# Patient Record
Sex: Female | Born: 1955 | Race: Black or African American | Hispanic: No | Marital: Married | Smoking: Never smoker
Health system: Southern US, Community
[De-identification: ages and names within clinical notes are randomized; demographics above are authoritative.]

## PROBLEM LIST (undated history)

## (undated) DIAGNOSIS — R232 Flushing: Secondary | ICD-10-CM

## (undated) DIAGNOSIS — D069 Carcinoma in situ of cervix, unspecified: Secondary | ICD-10-CM

## (undated) DIAGNOSIS — K439 Ventral hernia without obstruction or gangrene: Secondary | ICD-10-CM

## (undated) HISTORY — DX: Ventral hernia without obstruction or gangrene: K43.9

## (undated) HISTORY — PX: OTHER SURGICAL HISTORY: SHX169

## (undated) HISTORY — DX: Flushing: R23.2

## (undated) HISTORY — PX: ABDOMINAL HYSTERECTOMY: SHX81

## (undated) HISTORY — DX: Carcinoma in situ of cervix, unspecified: D06.9

## (undated) HISTORY — PX: COLON SURGERY: SHX602

---

## 2001-04-23 ENCOUNTER — Ambulatory Visit (HOSPITAL_COMMUNITY): Admission: RE | Admit: 2001-04-23 | Discharge: 2001-04-23 | Payer: Self-pay | Admitting: Obstetrics and Gynecology

## 2001-04-23 ENCOUNTER — Encounter: Payer: Self-pay | Admitting: Obstetrics and Gynecology

## 2002-04-28 ENCOUNTER — Ambulatory Visit (HOSPITAL_COMMUNITY): Admission: RE | Admit: 2002-04-28 | Discharge: 2002-04-28 | Payer: Self-pay | Admitting: Obstetrics and Gynecology

## 2002-04-28 ENCOUNTER — Encounter: Payer: Self-pay | Admitting: Obstetrics and Gynecology

## 2003-11-21 ENCOUNTER — Ambulatory Visit (HOSPITAL_COMMUNITY): Admission: RE | Admit: 2003-11-21 | Discharge: 2003-11-21 | Payer: Self-pay | Admitting: Obstetrics and Gynecology

## 2004-12-05 ENCOUNTER — Ambulatory Visit: Payer: Self-pay | Admitting: Orthopedic Surgery

## 2005-11-14 ENCOUNTER — Ambulatory Visit (HOSPITAL_COMMUNITY): Admission: RE | Admit: 2005-11-14 | Discharge: 2005-11-14 | Payer: Self-pay | Admitting: Obstetrics and Gynecology

## 2006-12-01 ENCOUNTER — Encounter: Payer: Self-pay | Admitting: Obstetrics and Gynecology

## 2006-12-01 ENCOUNTER — Observation Stay (HOSPITAL_COMMUNITY): Admission: RE | Admit: 2006-12-01 | Discharge: 2006-12-02 | Payer: Self-pay | Admitting: Obstetrics and Gynecology

## 2006-12-17 ENCOUNTER — Ambulatory Visit (HOSPITAL_COMMUNITY): Admission: RE | Admit: 2006-12-17 | Discharge: 2006-12-17 | Payer: Self-pay | Admitting: Obstetrics and Gynecology

## 2008-01-12 ENCOUNTER — Ambulatory Visit (HOSPITAL_COMMUNITY): Admission: RE | Admit: 2008-01-12 | Discharge: 2008-01-12 | Payer: Self-pay | Admitting: Obstetrics and Gynecology

## 2009-02-21 ENCOUNTER — Ambulatory Visit (HOSPITAL_COMMUNITY): Admission: RE | Admit: 2009-02-21 | Discharge: 2009-02-21 | Payer: Self-pay | Admitting: Obstetrics and Gynecology

## 2009-09-05 ENCOUNTER — Encounter: Payer: Self-pay | Admitting: Gastroenterology

## 2009-10-20 ENCOUNTER — Ambulatory Visit: Payer: Self-pay | Admitting: Gastroenterology

## 2009-10-20 ENCOUNTER — Ambulatory Visit (HOSPITAL_COMMUNITY): Admission: RE | Admit: 2009-10-20 | Discharge: 2009-10-20 | Payer: Self-pay | Admitting: Gastroenterology

## 2009-11-20 ENCOUNTER — Other Ambulatory Visit: Admission: RE | Admit: 2009-11-20 | Discharge: 2009-11-20 | Payer: Self-pay | Admitting: Obstetrics and Gynecology

## 2010-03-09 ENCOUNTER — Ambulatory Visit (HOSPITAL_COMMUNITY): Admission: RE | Admit: 2010-03-09 | Discharge: 2010-03-09 | Payer: Self-pay | Admitting: Obstetrics and Gynecology

## 2010-05-21 ENCOUNTER — Encounter: Payer: Self-pay | Admitting: Obstetrics and Gynecology

## 2010-05-29 NOTE — Letter (Signed)
Summary: [facility] ORDER  TCS ORDER   Imported By: Ave Filter 09/05/2009 09:20:56  _____________________________________________________________________  External Attachment:    Type:   Image     Comment:   External Document  Appended Document: [facility]CS ORDER HALFLYTELY.

## 2010-09-11 NOTE — Op Note (Signed)
Alexis Levine, Alexis Levine                 ACCOUNT NO.:  [id number]   MEDICAL RECORD NO.:  [id number]          PATIENT TYPE:  OBV   LOCATION:  A323                          FACILITY:  [facility]   PHYSICIAN:  Tilda Burrow, M.D. DATE OF BIRTH:  10/02/1955   DATE OF PROCEDURE:  12/01/2006  DATE OF DISCHARGE:  12/02/2006                               OPERATIVE REPORT   PREOPERATIVE DIAGNOSIS:  Cystocele, rectocele.   POSTOPERATIVE DIAGNOSIS:  Cystocele, rectocele.   OPERATION PERFORMED:  Anterior and posterior vaginal repair.   SURGEON:  Tilda Burrow, M.D.   ASSISTANTSharlot Gowda.   ANESTHESIA:  General.   COMPLICATIONS:  None.   ESTIMATED BLOOD LOSS:  50 mL.   FINDINGS:  Right-sided apical laxity beneath the bladder.   INDICATIONS FOR PROCEDURE:  A 55 year old female admitted for anterior  and posterior repair.   DESCRIPTION OF PROCEDURE:  The patient was taken to the operating room  for anterior and posterior repair after bowel prep.  See HPI for  details.  The procedure involved inspecting the perineum with the legs  in high lithotomy position.  The introitus was norma except for laxity.  The bladder bulged in an asymmetric fashion with some laxity on the  right side greater than the left side.  Beneath the bladder the vaginal  apex was actually well supported.  An ellipse of vaginal epithelium was  infiltrated with Xylocaine with epinephrine and then trimmed beneath the  cystocele, removing approximately 2 cm wide x 6 cm long ellipse of skin  and underlying tissues.  The bladder was left intact.  The defect was  closed after undermining the vaginal epithelium on either side slightly  and using transverse horizontal mattress sutures of 2-0 chromic to pull  the tissues together.  Bladder support was improved beneath the bladder  bulge.  The bulge could not be completely as the right side of the  bladder bulged asymmetrically.   Posterior repair was then addressed.  An Allis clamp  was placed at 5  o'clock and 7 o'clock at the ends of the hymen remnants and the  rectovaginal septum entered between the Allis clamps opening the vaginal  epithelium and dissecting the vaginal mucosa away from the underlying  connective tissue.  There was thin perineal body that ended up being  able to be dissected nicely on either side.  The patient had a site  specific defect on the left side which showed increased thickened tissue  on the right side where the tissues had fallen downward and to the  right.  This thickened tissue was able to be elevated upward and  attached to good pararectal supportive tissues on the right side  resulting in an elevation of the rectovaginal septum and an increased  thickening of that tissue.  The patient tolerated the procedure well.  The site specific repair consisted of four interrupted sutures of 0  Vicryl to pull the tissues into position.  We then dissected vaginal  epithelium further up the sides of the introitus and placed two  additional layers of interrupted 0 Vicryl resulting  in a thickening of  the perineal body, reduction of the vaginal introitus size and an even  improved support of the rectal tissues anteriorly.  The vaginal mucosa  only required a small bit of trimming but then was pulled together with  interrupted 2-0 Chromic.  Sponge and needle counts were correct  throughout.  The patient tolerated the procedure well, went to recovery  room in good condition.   ADDENDUM:  Patient has been seen back in the office for her four week  check up on her first check up on December 10, 2006 with good surgical  results and dramatically improved symptomatology.      Tilda Burrow, M.D.  Electronically Signed     JVF/MEDQ  D:  12/10/2006  T:  12/11/2006  Job:  [id number]   cc:   Annia Friendly. Loleta Chance, MD  Fax: [phone]

## 2010-09-11 NOTE — H&P (Signed)
NAMEBRENISHA, TSUI                 ACCOUNT NO.:  [id number]   MEDICAL RECORD NO.:  [id number]          PATIENT TYPE:  INP   LOCATION:  A323                          FACILITY:  [facility]   PHYSICIAN:  Tilda Burrow, M.D. DATE OF BIRTH:  [birth year]   DATE OF ADMISSION:  12/01/2006  DATE OF DISCHARGE:  08/05/2008LH                              HISTORY & PHYSICAL   ADMISSION DIAGNOSIS:  Cystocele,  rectocele.   HISTORY OF PRESENT ILLNESS:  This 55 year old female, gravida 2, para 2,  status post vaginal hysterectomy in 1985 who is admitted at this time  for anterior and posterior repair.  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  She has a sense of heaviness, pressure, rectal complaints.  She has  tried using a pessary for pelvic support and is frustrated by this  strategy.  She is admitted for anterior and posterior repair.  She  denies significant loss of urine when coughing or sneezing.  Bowel  movements are not a huge problem yet, though the rectocele is clinically  visually very significant and perineal laxity is noted by the patient.  There was slight fullness which has been evaluated and is simply the  presence of bowel in the pelvis, primarily small bowel based no  ultrasound which shows ovaries well out of the area of the vaginal apex.  Plans are to proceed with anterior and posterior repair.   PAST MEDICAL HISTORY:  Benign.   PAST SURGICAL HISTORY:  Right hemicolectomy by Dr. Katrinka Blazing for bowel  perforation.   PHYSICAL EXAMINATION:  GENERAL:  This is a generally, healthy, alert,  active, African American female.  VITAL SIGNS:  Height 5 feet 6 inches.  Weight 173 pounds.  Blood  pressure 112/70.  HEENT:  Pupils are equal, round and reactive.  Extraocular movements  intact.  NECK:  Supple. Normal thyroid.  CHEST:  Clear to auscultation  CARDIAC:   Regular rate and rhythm.  ABDOMEN:  Nontender without masses.  EXTERNAL GENITALIA:  Normal, multiparous with laxative introitus and  posterior rectocele, anterior laxity as well.  PELVIC:  Vaginal apex appears adequately supported at present with  ultrasound showing no evidence of ovarian  presence near the cuff.  EXTREMITIES:  Normal without cyanosis, clubbing or edema.   PLAN:  Anterior posterior repair December 01, 2006.      Tilda Burrow, M.D.  Electronically Signed     JVF/MEDQ  D:  12/01/2006  T:  12/01/2006  Job:  [id number]   cc:   Annia Friendly. Loleta Chance, MD  Fax: [phone]

## 2011-01-22 ENCOUNTER — Other Ambulatory Visit: Payer: Self-pay | Admitting: Adult Health

## 2011-01-22 ENCOUNTER — Other Ambulatory Visit (HOSPITAL_COMMUNITY)
Admission: RE | Admit: 2011-01-22 | Discharge: 2011-01-22 | Disposition: A | Discharge status: Home / Self Care | Payer: 59 | Source: Ambulatory Visit | Attending: Obstetrics and Gynecology | Admitting: Obstetrics and Gynecology

## 2011-01-22 DIAGNOSIS — Z01419 Encounter for gynecological examination (general) (routine) without abnormal findings: Secondary | ICD-10-CM | POA: Insufficient documentation

## 2011-02-11 LAB — COMPREHENSIVE METABOLIC PANEL
Alkaline Phosphatase: 57
BUN: 8
Creatinine, Ser: 0.61
Glucose, Bld: 85
Potassium: 4
Total Bilirubin: 0.6
Total Protein: 6.7

## 2011-02-11 LAB — CBC
HCT: 35 — ABNORMAL LOW
HCT: 38.2
Hemoglobin: 11.9 — ABNORMAL LOW
Hemoglobin: 13
MCV: 89.3
Platelets: 298
RBC: 3.9
RDW: 12.8
RDW: 13.4
WBC: 13.7 — ABNORMAL HIGH

## 2011-02-11 LAB — DIFFERENTIAL
Basophils Absolute: 0
Basophils Absolute: 0
Basophils Relative: 1
Eosinophils Relative: 0
Lymphocytes Relative: 6 — ABNORMAL LOW
Lymphocytes Relative: 29
Lymphs Abs: 0.8
Monocytes Absolute: 0.7
Monocytes Relative: 5
Monocytes Relative: 6
Neutro Abs: 12.2 — ABNORMAL HIGH
Neutro Abs: 3.5
Neutrophils Relative %: 63

## 2011-04-10 ENCOUNTER — Other Ambulatory Visit: Payer: Self-pay | Admitting: Obstetrics and Gynecology

## 2011-04-10 DIAGNOSIS — Z139 Encounter for screening, unspecified: Secondary | ICD-10-CM

## 2011-04-11 ENCOUNTER — Ambulatory Visit (HOSPITAL_COMMUNITY)
Admission: RE | Admit: 2011-04-11 | Discharge: 2011-04-11 | Disposition: A | Discharge status: Home / Self Care | Payer: BC Managed Care – PPO | Source: Ambulatory Visit | Attending: Obstetrics and Gynecology | Admitting: Obstetrics and Gynecology

## 2011-04-11 DIAGNOSIS — Z1231 Encounter for screening mammogram for malignant neoplasm of breast: Secondary | ICD-10-CM | POA: Insufficient documentation

## 2011-04-11 DIAGNOSIS — Z139 Encounter for screening, unspecified: Secondary | ICD-10-CM

## 2012-04-24 ENCOUNTER — Other Ambulatory Visit: Payer: Self-pay | Admitting: Obstetrics and Gynecology

## 2012-04-24 DIAGNOSIS — Z139 Encounter for screening, unspecified: Secondary | ICD-10-CM

## 2012-04-28 ENCOUNTER — Ambulatory Visit (HOSPITAL_COMMUNITY)
Admission: RE | Admit: 2012-04-28 | Discharge: 2012-04-28 | Disposition: A | Discharge status: Home / Self Care | Payer: BC Managed Care – PPO | Source: Ambulatory Visit | Attending: Obstetrics and Gynecology | Admitting: Obstetrics and Gynecology

## 2012-04-28 DIAGNOSIS — Z1231 Encounter for screening mammogram for malignant neoplasm of breast: Secondary | ICD-10-CM | POA: Insufficient documentation

## 2012-04-28 DIAGNOSIS — Z139 Encounter for screening, unspecified: Secondary | ICD-10-CM

## 2013-04-19 ENCOUNTER — Ambulatory Visit: Payer: Self-pay | Admitting: Adult Health

## 2013-04-26 ENCOUNTER — Encounter: Payer: Self-pay | Admitting: Adult Health

## 2013-04-26 ENCOUNTER — Ambulatory Visit (INDEPENDENT_AMBULATORY_CARE_PROVIDER_SITE_OTHER): Payer: BC Managed Care – PPO | Admitting: Adult Health

## 2013-04-26 VITALS — BP 120/80 | Ht 64.5 in | Wt 180.0 lb

## 2013-04-26 DIAGNOSIS — N951 Menopausal and female climacteric states: Secondary | ICD-10-CM

## 2013-04-26 DIAGNOSIS — J069 Acute upper respiratory infection, unspecified: Secondary | ICD-10-CM

## 2013-04-26 DIAGNOSIS — R232 Flushing: Secondary | ICD-10-CM

## 2013-04-26 HISTORY — DX: Flushing: R23.2

## 2013-04-26 MED ORDER — AZITHROMYCIN 250 MG PO TABS: ORAL_TABLET | ORAL | Status: DC

## 2013-04-26 MED ORDER — ESTRADIOL 0.1 MG/24HR TD PTWK: 0.1000 mg | MEDICATED_PATCH | TRANSDERMAL | Status: DC

## 2013-04-26 NOTE — Progress Notes (Signed)
Subjective:     Patient ID: Alexis Levine, female   DOB: [birth year], 57 y.o.   MRN: [id number]  HPI Alexis Levine is a 57 year old black female,married in complaining of hot flashes, after stopping patch in November.Has had cough for 3 weeks and sinus congestion had low grade fever last week.  Review of Systems See HPI Reviewed past medical,surgical, social and family history. Reviewed medications and allergies.     Objective:   Physical Exam BP 120/80  Ht 5' 4.5" (1.638 m)  Wt 180 lb (81.647 kg)  BMI 30.43 kg/m2   Skin warm and dry. Lungs: clear to ausculation bilaterally. Cardiovascular: regular rate and rhythm. Assessment:     Hot flashes URI    Plan:     Rx estradiol patch 0.1 mg #4 1 patch weekly with 11 refills Rx Z pack Follow up as scheduled for physical

## 2013-04-26 NOTE — Patient Instructions (Signed)
Resume patch Return for physical

## 2013-05-04 ENCOUNTER — Ambulatory Visit (INDEPENDENT_AMBULATORY_CARE_PROVIDER_SITE_OTHER): Payer: BC Managed Care – PPO | Admitting: Adult Health

## 2013-05-04 ENCOUNTER — Encounter: Payer: Self-pay | Admitting: Adult Health

## 2013-05-04 ENCOUNTER — Other Ambulatory Visit (HOSPITAL_COMMUNITY)
Admission: RE | Admit: 2013-05-04 | Discharge: 2013-05-04 | Disposition: A | Discharge status: Home / Self Care | Payer: BC Managed Care – PPO | Source: Ambulatory Visit | Attending: Adult Health | Admitting: Adult Health

## 2013-05-04 VITALS — BP 110/60 | HR 74 | Ht 64.5 in | Wt 181.0 lb

## 2013-05-04 DIAGNOSIS — Z01419 Encounter for gynecological examination (general) (routine) without abnormal findings: Secondary | ICD-10-CM | POA: Insufficient documentation

## 2013-05-04 DIAGNOSIS — Z1212 Encounter for screening for malignant neoplasm of rectum: Secondary | ICD-10-CM

## 2013-05-04 DIAGNOSIS — Z79899 Other long term (current) drug therapy: Secondary | ICD-10-CM

## 2013-05-04 DIAGNOSIS — Z1151 Encounter for screening for human papillomavirus (HPV): Secondary | ICD-10-CM | POA: Insufficient documentation

## 2013-05-04 LAB — HEMOCCULT GUIAC POC 1CARD (OFFICE): FECAL OCCULT BLD: NEGATIVE

## 2013-05-04 NOTE — Progress Notes (Signed)
Patient ID: Alexis KelchDebra Levine, female   DOB: 01/29/1956, 58 y.o.   MRN: [id number] History of Present Illness: Alexis KidneyDebra is a 58 year old black female, married in for a pap and physical.Has some vaginal dryness, but feels much better back on patch.   Current Medications, Allergies, Past Medical History, Past Surgical History, Family History and Social History were reviewed in Alexis Levine Link electronic medical record.   Past Medical History  Diagnosis Date  . Hot flashes 04/26/2013   Past Surgical History  Procedure Laterality Date  . Abdominal hysterectomy    . Colon surgery    . Bladder tact    Current outpatient prescriptions:estradiol (CLIMARA - DOSED IN MG/24 HR) 0.1 mg/24hr patch, Place 1 patch (0.1 mg total) onto the skin once a week., Disp: 4 patch, Rfl: 12;  azithromycin (ZITHROMAX) 250 MG tablet, Take 2 now and 1 daily x 4 days, Disp: 6 tablet, Rfl: 0  Review of Systems: Patient denies any headaches, blurred vision, shortness of breath, chest pain, abdominal pain, problems with bowel movements, urination, or intercourse. No joint pain or mood swings, see HPI.    Physical Exam:BP 110/60  Pulse 74  Ht 5' 4.5" (1.638 m)  Wt 181 lb (82.101 kg)  BMI 30.60 kg/m2 General:  Well developed, well nourished, no acute distress Skin:  Warm and dry Neck:  Midline trachea, normal thyroid Lungs; Clear to auscultation bilaterally Breast:  No dominant palpable mass, retraction, or nipple discharge Cardiovascular: Regular rate and rhythm Abdomen:  Soft, non tender, no hepatosplenomegaly, has small inguinal hernia that does not bother her. Pelvic:  External genitalia is normal in appearance.  The vagina is normal in appearance. The cervix and uterus are absent. Pap perfomed on vaginal area.  No  adnexal masses or tenderness noted. Rectal: Good sphincter tone, no polyps, or hemorrhoids felt.  Hemoccult negative.Has rectocele Extremities:  No swelling or varicosities noted Psych:  No mood changes, alert  and cooperative, seems happy Discussed trying luvena for vaginal moisture and astro glide for sex.   Impression: Yearly gyn exam Estrogen therapy   Plan: Physical in 2 years Mammogram yearly Colonoscopy per [facility] at work Try [facility]

## 2013-05-04 NOTE — Patient Instructions (Signed)
Physical in 1 year Mammogram yearly  Colonoscopy as per GI [facility] at work Try [facility]

## 2013-06-07 ENCOUNTER — Other Ambulatory Visit: Payer: Self-pay | Admitting: Obstetrics and Gynecology

## 2013-06-07 DIAGNOSIS — Z139 Encounter for screening, unspecified: Secondary | ICD-10-CM

## 2013-06-11 ENCOUNTER — Ambulatory Visit (HOSPITAL_COMMUNITY)
Admission: RE | Admit: 2013-06-11 | Discharge: 2013-06-11 | Disposition: A | Discharge status: Home / Self Care | Payer: BC Managed Care – PPO | Source: Ambulatory Visit | Attending: Obstetrics and Gynecology | Admitting: Obstetrics and Gynecology

## 2013-06-11 DIAGNOSIS — Z1231 Encounter for screening mammogram for malignant neoplasm of breast: Secondary | ICD-10-CM | POA: Insufficient documentation

## 2013-06-11 DIAGNOSIS — Z139 Encounter for screening, unspecified: Secondary | ICD-10-CM

## 2014-02-28 ENCOUNTER — Encounter: Payer: Self-pay | Admitting: Adult Health

## 2014-07-12 ENCOUNTER — Other Ambulatory Visit: Payer: Self-pay | Admitting: Adult Health

## 2014-07-21 ENCOUNTER — Ambulatory Visit (INDEPENDENT_AMBULATORY_CARE_PROVIDER_SITE_OTHER): Payer: BLUE CROSS/BLUE SHIELD | Admitting: Adult Health

## 2014-07-21 ENCOUNTER — Encounter: Payer: Self-pay | Admitting: Adult Health

## 2014-07-21 VITALS — BP 130/80 | HR 80 | Ht 64.0 in | Wt 185.0 lb

## 2014-07-21 DIAGNOSIS — Z01419 Encounter for gynecological examination (general) (routine) without abnormal findings: Secondary | ICD-10-CM | POA: Diagnosis not present

## 2014-07-21 DIAGNOSIS — K439 Ventral hernia without obstruction or gangrene: Secondary | ICD-10-CM

## 2014-07-21 DIAGNOSIS — Z1212 Encounter for screening for malignant neoplasm of rectum: Secondary | ICD-10-CM | POA: Diagnosis not present

## 2014-07-21 DIAGNOSIS — Z79899 Other long term (current) drug therapy: Secondary | ICD-10-CM

## 2014-07-21 HISTORY — DX: Ventral hernia without obstruction or gangrene: K43.9

## 2014-07-21 LAB — HEMOCCULT GUIAC POC 1CARD (OFFICE): Fecal Occult Blood, POC: NEGATIVE

## 2014-07-21 NOTE — Patient Instructions (Signed)
Physical in 1 year Mammogram yearly Colonoscopy per GI [facility] at work 

## 2014-07-21 NOTE — Progress Notes (Signed)
Patient ID: Alexis KelchDebra Levine, female   DOB: 11/10/1955, 59 y.o.   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  Current Medications, Allergies, Past Medical History, Past Surgical History, Family History and Social History were reviewed in [facility] Link electronic medical record.     Review of Systems: Patient denies any headaches, hearing loss, fatigue, blurred vision, shortness of breath, chest pain, abdominal pain, problems with bowel movements, urination, or intercourse. No joint pain or mood swings.+hot flashes    Physical Exam:BP 130/80 mmHg  Pulse 80  Ht 5\' 4"  (1.626 m)  Wt 185 lb (83.915 kg)  BMI 31.74 kg/m2 General:  Well developed, well nourished, no acute distress Skin:  Warm and dry Neck:  Midline trachea, normal thyroid, good ROM, no lymphadenopathy Lungs; Clear to auscultation bilaterally Breast:  No dominant palpable mass, retraction, or nipple discharge Cardiovascular: Regular rate and rhythm Abdomen:  Soft, non tender, no hepatosplenomegaly, has small ventral hernia LLQ, has noticed for about 10 years or so, no pain Pelvic:  External genitalia is normal in appearance, no lesions.  The vagina is pale, good moisture and has loss of rugae. Urethra has no lesions or masses. The cervix and uterus are absent.  No adnexal masses or tenderness noted.Bladder is non tender, no masses felt. Rectal: Good sphincter tone, no polyps, or hemorrhoids felt.  Hemoccult negative.+ rectocele Extremities/musculoskeletal:  No swelling or varicosities noted, no clubbing or cyanosis Psych:  No mood changes, alert and cooperative,seems happy   Impression: Well woman  gyn exam no pap Ventral hernia ET   Plan: Physical in 1 year Mammogram yearly Labs at work Colonoscopy per GI Continue climara patch has refills If has pain at hernia go to ER

## 2015-08-10 ENCOUNTER — Other Ambulatory Visit: Payer: Self-pay | Admitting: Obstetrics and Gynecology

## 2015-08-10 DIAGNOSIS — Z1231 Encounter for screening mammogram for malignant neoplasm of breast: Secondary | ICD-10-CM

## 2015-08-21 ENCOUNTER — Ambulatory Visit (INDEPENDENT_AMBULATORY_CARE_PROVIDER_SITE_OTHER): Payer: BLUE CROSS/BLUE SHIELD | Admitting: Adult Health

## 2015-08-21 ENCOUNTER — Encounter: Payer: Self-pay | Admitting: Adult Health

## 2015-08-21 VITALS — BP 126/90 | HR 68 | Ht 64.0 in | Wt 179.0 lb

## 2015-08-21 DIAGNOSIS — Z79899 Other long term (current) drug therapy: Secondary | ICD-10-CM

## 2015-08-21 DIAGNOSIS — Z90710 Acquired absence of both cervix and uterus: Secondary | ICD-10-CM | POA: Diagnosis not present

## 2015-08-21 DIAGNOSIS — Z1211 Encounter for screening for malignant neoplasm of colon: Secondary | ICD-10-CM | POA: Diagnosis not present

## 2015-08-21 DIAGNOSIS — Z79818 Long term (current) use of other agents affecting estrogen receptors and estrogen levels: Secondary | ICD-10-CM

## 2015-08-21 DIAGNOSIS — Z01419 Encounter for gynecological examination (general) (routine) without abnormal findings: Secondary | ICD-10-CM | POA: Diagnosis not present

## 2015-08-21 LAB — HEMOCCULT GUIAC POC 1CARD (OFFICE): Fecal Occult Blood, POC: NEGATIVE

## 2015-08-21 MED ORDER — PROMETHAZINE HCL 25 MG PO TABS: 25.0000 mg | ORAL_TABLET | Freq: Four times a day (QID) | ORAL | Status: DC | PRN

## 2015-08-21 MED ORDER — ESTRADIOL 0.1 MG/24HR TD PTWK: MEDICATED_PATCH | TRANSDERMAL | Status: DC

## 2015-08-21 MED ORDER — CIPROFLOXACIN HCL 500 MG PO TABS: 500.0000 mg | ORAL_TABLET | Freq: Two times a day (BID) | ORAL | Status: DC

## 2015-08-21 NOTE — Progress Notes (Signed)
Patient ID: Vertis KelchDebra Harps, female   DOB: 07/09/1955, 60 y.o.   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 She is going on cruise in May.  Current Medications, Allergies, Past Medical History, Past Surgical History, Family History and Social History were reviewed in [facility] Link electronic medical record.     Review of Systems: Patient denies any headaches, hearing loss, fatigue, blurred vision, shortness of breath, chest pain, abdominal pain, problems with bowel movements, urination, or intercourse. No joint pain or mood swings.    Physical Exam:BP 126/90 mmHg  Pulse 68  Ht 5\' 4"  (1.626 m)  Wt 179 lb (81.194 kg)  BMI 30.71 kg/m2 General:  Well developed, well nourished, no acute distress Skin:  Warm and dry Neck:  Midline trachea, normal thyroid, good ROM, no lymphadenopathy Lungs; Clear to auscultation bilaterally Breast:  No dominant palpable mass, retraction, or nipple discharge Cardiovascular: Regular rate and rhythm Abdomen:  Soft, non tender, no hepatosplenomegaly Pelvic:  External genitalia is normal in appearance, no lesions.  The vagina is normal in appearance. Urethra has no lesions or masses. The cervix and uterus are absent. No adnexal masses or tenderness noted.Bladder is non tender, no masses felt. Rectal: Good sphincter tone, no polyps, or hemorrhoids felt.  Hemoccult negative. Extremities/musculoskeletal:  No swelling or varicosities noted, no clubbing or cyanosis,mild rectoecele Psych:  No mood changes, alert and cooperative,seems happy   Impression: Well woman gyn exam no pap Current use ET   Plan: Refilled climara patch x 1 year Rx phenergan 25 mg #30 take 1 every 6 hours prn N/V with 1 refill Rx cipro 500 mg #20 take 1 bid for cruise  Physical in 1 year Mammogram  yearly Colonoscopy per GI Labs at work

## 2015-08-21 NOTE — Patient Instructions (Signed)
Physical in 1 year Mammogram yearly Colonoscopy per GI [facility] at work 

## 2015-08-23 ENCOUNTER — Ambulatory Visit (HOSPITAL_COMMUNITY)
Admission: RE | Admit: 2015-08-23 | Discharge: 2015-08-23 | Disposition: A | Discharge status: Home / Self Care | Payer: BLUE CROSS/BLUE SHIELD | Source: Ambulatory Visit | Attending: Obstetrics and Gynecology | Admitting: Obstetrics and Gynecology

## 2015-08-23 DIAGNOSIS — Z1231 Encounter for screening mammogram for malignant neoplasm of breast: Secondary | ICD-10-CM | POA: Insufficient documentation

## 2016-09-25 ENCOUNTER — Other Ambulatory Visit: Payer: Self-pay | Admitting: Adult Health

## 2016-11-11 ENCOUNTER — Other Ambulatory Visit (HOSPITAL_COMMUNITY)
Admission: RE | Admit: 2016-11-11 | Discharge: 2016-11-11 | Disposition: A | Discharge status: Home / Self Care | Payer: Commercial Managed Care - PPO | Source: Ambulatory Visit | Attending: Adult Health | Admitting: Adult Health

## 2016-11-11 ENCOUNTER — Ambulatory Visit (INDEPENDENT_AMBULATORY_CARE_PROVIDER_SITE_OTHER): Payer: Commercial Managed Care - PPO | Admitting: Adult Health

## 2016-11-11 ENCOUNTER — Encounter: Payer: Self-pay | Admitting: Adult Health

## 2016-11-11 VITALS — BP 110/80 | HR 80 | Ht 65.0 in | Wt 183.0 lb

## 2016-11-11 DIAGNOSIS — Z79890 Hormone replacement therapy (postmenopausal): Secondary | ICD-10-CM | POA: Diagnosis not present

## 2016-11-11 DIAGNOSIS — Z01419 Encounter for gynecological examination (general) (routine) without abnormal findings: Secondary | ICD-10-CM | POA: Diagnosis not present

## 2016-11-11 DIAGNOSIS — Z1212 Encounter for screening for malignant neoplasm of rectum: Secondary | ICD-10-CM | POA: Diagnosis not present

## 2016-11-11 DIAGNOSIS — Z79818 Long term (current) use of other agents affecting estrogen receptors and estrogen levels: Secondary | ICD-10-CM

## 2016-11-11 DIAGNOSIS — Z79899 Other long term (current) drug therapy: Secondary | ICD-10-CM

## 2016-11-11 DIAGNOSIS — Z1211 Encounter for screening for malignant neoplasm of colon: Secondary | ICD-10-CM

## 2016-11-11 LAB — HEMOCCULT GUIAC POC 1CARD (OFFICE): FECAL OCCULT BLD: NEGATIVE

## 2016-11-11 MED ORDER — PROMETHAZINE HCL 25 MG PO TABS: 25.0000 mg | ORAL_TABLET | Freq: Four times a day (QID) | ORAL | 1 refills | Status: DC | PRN

## 2016-11-11 MED ORDER — ESTRADIOL 0.1 MG/24HR TD PTWK: MEDICATED_PATCH | TRANSDERMAL | 12 refills | Status: DC

## 2016-11-11 MED ORDER — CIPROFLOXACIN HCL 500 MG PO TABS: 500.0000 mg | ORAL_TABLET | Freq: Two times a day (BID) | ORAL | 1 refills | Status: DC

## 2016-11-11 NOTE — Addendum Note (Signed)
Addended by: Cyril MourningGRIFFIN, JENNIFER A on: 11/11/2016 11:00 AM   Modules accepted: Orders

## 2016-11-11 NOTE — Addendum Note (Signed)
Addended by: Federico FlakeNES, PEGGY A on: 11/11/2016 11:10 AM   Modules accepted: Orders

## 2016-11-11 NOTE — Progress Notes (Signed)
Patient ID: Alexis KelchDebra Levine, female   DOB: 03/12/1956, 61 y.o.   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 She is active with her grandchildren too, and goes on cruises often.    Current Medications, Allergies, Past Medical History, Past Surgical History, Family History and Social History were reviewed in [facility] Link electronic medical record.     Review of Systems:  Patient denies any headaches, hearing loss, fatigue, blurred vision, shortness of breath, chest pain, abdominal pain, problems with bowel movements, urination(occasional SUI if had caffeine), or intercourse. No joint pain or mood swings.    Physical Exam:BP 110/80 (BP Location: Left Arm, Patient Position: Sitting, Cuff Size: Small)   Pulse 80   Ht 5\' 5"  (1.651 m)   Wt 183 lb (83 kg)   BMI 30.45 kg/m  General:  Well developed, well nourished, no acute distress Skin:  Warm and dry Neck:  Midline trachea, normal thyroid, good ROM, no lymphadenopathy Lungs; Clear to auscultation bilaterally Breast:  No dominant palpable mass, retraction, or nipple discharge Cardiovascular: Regular rate and rhythm Abdomen:  Soft, non tender, no hepatosplenomegaly Pelvic:  External genitalia is normal in appearance, no lesions.  The vagina is normal in appearance. Urethra has no lesions or masses. The cervix and uterus are absent, pap with HPV performed on vagina.  No adnexal masses or tenderness noted.Bladder is non tender, no masses felt. Rectal: Good sphincter tone, no polyps, or hemorrhoids felt.  Hemoccult negative.+rectocele Extremities/musculoskeletal:  No swelling or varicosities noted, no clubbing or cyanosis Psych:  No mood changes, alert and cooperative,seems happy PHQ 2 score 0. She requests refills on phenergan and  cipro.  Impression: 1. Well woman exam with routine gynecological exam   2. Current use of estrogen therapy   3. Screening for colorectal cancer       Plan: Physical in 1 year Meds ordered this encounter  Medications  . promethazine (PHENERGAN) 25 MG tablet    Sig: Take 1 tablet (25 mg total) by mouth every 6 (six) hours as needed for nausea or vomiting.    Dispense:  30 tablet    Refill:  1    Order Specific Question:   Supervising Provider    Answer:   Alexis Levine [[id number]]  . ciprofloxacin (CIPRO) 500 MG tablet    Sig: Take 1 tablet (500 mg total) by mouth 2 (two) times daily.    Dispense:  14 tablet    Refill:  1    Order Specific Question:   Supervising Provider    Answer:   Alexis Levine [[id number]]  . estradiol (CLIMARA - DOSED IN MG/24 HR) 0.1 mg/24hr patch    Sig: APPLY ONE PATCH TOPICALLY ONCE A WEEK --  **REMOVE  AND  DISCARD  USED  PATCHES**    Dispense:  4 patch    Refill:  12    Order Specific Question:   Supervising Provider    Answer:   Alexis Levine [[id number]]  Mammogram yearly Labs at work Colonoscopy per GI

## 2016-11-12 LAB — CYTOLOGY - PAP
Adequacy: ABSENT
Diagnosis: NEGATIVE
HPV (WINDOPATH): NOT DETECTED

## 2017-11-14 ENCOUNTER — Other Ambulatory Visit: Payer: Self-pay | Admitting: Adult Health

## 2018-01-14 ENCOUNTER — Other Ambulatory Visit: Payer: Self-pay | Admitting: Obstetrics and Gynecology

## 2018-01-14 DIAGNOSIS — Z1231 Encounter for screening mammogram for malignant neoplasm of breast: Secondary | ICD-10-CM

## 2018-01-15 ENCOUNTER — Ambulatory Visit (HOSPITAL_COMMUNITY)
Admission: RE | Admit: 2018-01-15 | Discharge: 2018-01-15 | Disposition: A | Discharge status: Home / Self Care | Payer: Commercial Managed Care - PPO | Source: Ambulatory Visit | Attending: Obstetrics and Gynecology | Admitting: Obstetrics and Gynecology

## 2018-01-15 DIAGNOSIS — Z1231 Encounter for screening mammogram for malignant neoplasm of breast: Secondary | ICD-10-CM | POA: Insufficient documentation

## 2018-10-20 ENCOUNTER — Other Ambulatory Visit: Payer: Self-pay | Admitting: *Deleted

## 2018-10-21 MED ORDER — ESTRADIOL 0.1 MG/24HR TD PTWK: MEDICATED_PATCH | TRANSDERMAL | 12 refills | Status: DC

## 2018-11-26 ENCOUNTER — Telehealth: Payer: Self-pay | Admitting: Adult Health

## 2018-11-26 MED ORDER — ESTRADIOL 0.1 MG/24HR TD PTWK: MEDICATED_PATCH | TRANSDERMAL | 4 refills | Status: DC

## 2018-11-26 NOTE — Telephone Encounter (Signed)
Requests estrogen patch be refilled for 3 month supply, will do

## 2019-02-22 ENCOUNTER — Other Ambulatory Visit (HOSPITAL_COMMUNITY): Payer: Self-pay | Admitting: Obstetrics and Gynecology

## 2019-02-22 DIAGNOSIS — Z1231 Encounter for screening mammogram for malignant neoplasm of breast: Secondary | ICD-10-CM

## 2019-03-11 ENCOUNTER — Ambulatory Visit (HOSPITAL_COMMUNITY)
Admission: RE | Admit: 2019-03-11 | Discharge: 2019-03-11 | Disposition: A | Discharge status: Home / Self Care | Payer: Commercial Managed Care - PPO | Source: Ambulatory Visit | Attending: Obstetrics and Gynecology | Admitting: Obstetrics and Gynecology

## 2019-03-11 ENCOUNTER — Other Ambulatory Visit: Payer: Self-pay

## 2019-03-11 DIAGNOSIS — Z1231 Encounter for screening mammogram for malignant neoplasm of breast: Secondary | ICD-10-CM | POA: Insufficient documentation

## 2019-05-17 ENCOUNTER — Other Ambulatory Visit: Payer: Self-pay

## 2019-05-17 ENCOUNTER — Ambulatory Visit: Payer: Commercial Managed Care - PPO | Attending: Internal Medicine

## 2019-05-17 DIAGNOSIS — Z20822 Contact with and (suspected) exposure to covid-19: Secondary | ICD-10-CM

## 2019-05-18 LAB — NOVEL CORONAVIRUS, NAA: SARS-CoV-2, NAA: NOT DETECTED

## 2020-03-29 ENCOUNTER — Other Ambulatory Visit (HOSPITAL_COMMUNITY): Payer: Self-pay | Admitting: Adult Health

## 2020-03-29 DIAGNOSIS — Z1231 Encounter for screening mammogram for malignant neoplasm of breast: Secondary | ICD-10-CM

## 2020-04-21 ENCOUNTER — Inpatient Hospital Stay (HOSPITAL_COMMUNITY)
Admission: EM | Admit: 2020-04-21 | Discharge: 2020-04-24 | DRG: 378 | Disposition: A | Discharge status: Home / Self Care | Payer: Commercial Managed Care - PPO | Attending: Family Medicine | Admitting: Family Medicine

## 2020-04-21 ENCOUNTER — Other Ambulatory Visit: Payer: Self-pay

## 2020-04-21 ENCOUNTER — Encounter (HOSPITAL_COMMUNITY): Payer: Self-pay | Admitting: *Deleted

## 2020-04-21 DIAGNOSIS — Z809 Family history of malignant neoplasm, unspecified: Secondary | ICD-10-CM

## 2020-04-21 DIAGNOSIS — Z20822 Contact with and (suspected) exposure to covid-19: Secondary | ICD-10-CM | POA: Diagnosis present

## 2020-04-21 DIAGNOSIS — Z8261 Family history of arthritis: Secondary | ICD-10-CM

## 2020-04-21 DIAGNOSIS — Z86001 Personal history of in-situ neoplasm of cervix uteri: Secondary | ICD-10-CM

## 2020-04-21 DIAGNOSIS — Z79899 Other long term (current) drug therapy: Secondary | ICD-10-CM

## 2020-04-21 DIAGNOSIS — K5731 Diverticulosis of large intestine without perforation or abscess with bleeding: Secondary | ICD-10-CM | POA: Diagnosis not present

## 2020-04-21 DIAGNOSIS — Z9049 Acquired absence of other specified parts of digestive tract: Secondary | ICD-10-CM

## 2020-04-21 DIAGNOSIS — R739 Hyperglycemia, unspecified: Secondary | ICD-10-CM | POA: Diagnosis present

## 2020-04-21 DIAGNOSIS — Z79890 Hormone replacement therapy (postmenopausal): Secondary | ICD-10-CM

## 2020-04-21 DIAGNOSIS — Z91013 Allergy to seafood: Secondary | ICD-10-CM

## 2020-04-21 DIAGNOSIS — K922 Gastrointestinal hemorrhage, unspecified: Secondary | ICD-10-CM

## 2020-04-21 DIAGNOSIS — E876 Hypokalemia: Secondary | ICD-10-CM | POA: Diagnosis present

## 2020-04-21 DIAGNOSIS — D62 Acute posthemorrhagic anemia: Secondary | ICD-10-CM | POA: Diagnosis present

## 2020-04-21 DIAGNOSIS — Z8249 Family history of ischemic heart disease and other diseases of the circulatory system: Secondary | ICD-10-CM

## 2020-04-21 NOTE — ED Provider Notes (Signed)
[facility] Provider Note   CSN: [id number] Arrival date & time: 04/21/20  2219     History Chief Complaint  Patient presents with  . Rectal Bleeding    Alexis Levine is a 64 y.o. female.  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  Patient reports she was at home tonight playing cards with her husband and another couple when she had the urge to go to the bathroom.  She states that she noted gross blood in the toilet and several large clots.  She did not have any abdominal pain.  She states she had a second bowel movement which was similar and has since had a third while in the emergency room.  She has no abdominal pain.  She states that at one time she had 5 minutes of some "cramping."  No recent fevers.  No history of GI bleed.  She is not on any blood thinners.  Denies NSAIDs or significant alcohol use.  Endoscopy reviewed from 2011.  Status post right hemicolectomy.  Pandiverticulosis and small hemorrhoids noted.  Past Medical History:  Diagnosis Date  . CIS (carcinoma in situ of cervix)   . Hot flashes 04/26/2013  . Ventral hernia 07/21/2014    Patient Active Problem List   Diagnosis Date Noted  . Screening for colorectal cancer 11/11/2016  . Ventral hernia 07/21/2014  . Current use of estrogen therapy 05/04/2013  . Hot flashes 04/26/2013    Past Surgical History:  Procedure Laterality Date  . ABDOMINAL HYSTERECTOMY    . bladder tact    . COLON SURGERY       OB History    Gravida  2   Para  2   Term      Preterm      AB      Living  2     SAB      IAB      Ectopic      Multiple      Live Births  2           Family History  Problem Relation Age of Onset  . Hypertension Mother   . Arthritis Mother   . Dementia Mother   . Early death Father   . Cancer Brother     Social History   Tobacco Use  . Smoking status:  Never Smoker  . Smokeless tobacco: Never Used  Vaping Use  . Vaping Use: Never used  Substance Use Topics  . Alcohol use: No  . Drug use: No    Home Medications Prior to Admission medications   Medication Sig Start Date End Date Taking? Authorizing Provider  ciprofloxacin (CIPRO) 500 MG tablet Take 1 tablet (500 mg total) by mouth 2 (two) times daily. 11/11/16   Adline Potter, NP  estradiol (CLIMARA - DOSED IN MG/24 HR) 0.1 mg/24hr patch APPLY 1 PATCH TOPICALLY ONCE A WEEK **REMOVE  AND  DISCARD  USED  PATCHES** 11/26/18   Adline Potter, NP  Multiple Vitamin (MULTIVITAMIN) tablet Take 1 tablet by mouth daily.    [provider]  promethazine (PHENERGAN) 25 MG tablet Take 1 tablet (25 mg total) by mouth every 6 (six) hours as needed for nausea or vomiting. 11/11/16   Adline Potter, NP    Allergies    Shrimp [shellfish allergy]  Review of Systems   Review of Systems  Constitutional: Negative for fever.  Respiratory: Negative for shortness of breath.   Cardiovascular: Negative for chest pain.  Gastrointestinal: Positive for blood in stool. Negative for abdominal pain, diarrhea, nausea and vomiting.  Genitourinary: Negative for dysuria.  All other systems reviewed and are negative.   Physical Exam Updated Vital Signs BP 110/77 (BP Location: Left Arm)   Pulse 78   Temp 98.1 F (36.7 C) (Oral)   Resp 16   Ht 1.638 m (5' 4.5")   Wt 84.4 kg   SpO2 100%   BMI 31.43 kg/m   Physical Exam Vitals and nursing note reviewed.  Constitutional:      Appearance: She is well-developed and well-nourished. She is not ill-appearing.  HENT:     Head: Normocephalic and atraumatic.     Nose: Nose normal.     Mouth/Throat:     Mouth: Mucous membranes are moist.  Eyes:     Pupils: Pupils are equal, round, and reactive to light.  Cardiovascular:     Rate and Rhythm: Normal rate and regular rhythm.     Heart sounds: Normal heart sounds.  Pulmonary:     Effort:  Pulmonary effort is normal. No respiratory distress.     Breath sounds: No wheezing.  Abdominal:     General: Bowel sounds are normal.     Palpations: Abdomen is soft.     Tenderness: There is no abdominal tenderness. There is no guarding or rebound.  Genitourinary:    Comments: Exam deferred, grossly bloody stool noted by nursing Musculoskeletal:     Cervical back: Neck supple.     Right lower leg: No edema.     Left lower leg: No edema.  Skin:    General: Skin is warm and dry.  Neurological:     Mental Status: She is alert and oriented to person, place, and time.  Psychiatric:        Mood and Affect: Mood and affect and mood normal.     ED Results / Procedures / Treatments   Labs (all labs ordered are listed, but only abnormal results are displayed) Labs Reviewed  COMPREHENSIVE METABOLIC PANEL - Abnormal; Notable for the following components:      Result Value   Sodium 134 (*)    Potassium 3.3 (*)    Glucose, Bld 126 (*)    All other components within normal limits  HEMOGLOBIN AND HEMATOCRIT, BLOOD - Abnormal; Notable for the following components:   Hemoglobin 10.6 (*)    HCT 32.0 (*)    All other components within normal limits  RESP PANEL BY RT-PCR (FLU A&B, COVID) ARPGX2  CBC WITH DIFFERENTIAL/PLATELET  TYPE AND SCREEN  ABO/RH    EKG None  Radiology No results found.  Procedures Procedures (including critical care time)  Medications Ordered in ED Medications - No data to display  ED Course  I have reviewed the triage vital signs and the nursing notes.  Pertinent labs & imaging results that were available during my care of the patient were reviewed by me and considered in my medical decision making (see chart for details).  Clinical Course as of 04/22/20 6160  Sat Apr 22, 2020  0115 Patient updated.  Normal hemoglobin.  No further bloody bowel movements while the emergency department. [CH]  0116 Repeat hemoglobin 10.6.  Unfortunately, patient has had  recurrent bloody stools since last check.  She remains hemodynamically stable.  High suspicion for diverticular bleed.  Given ongoing bleeding and downtrending hemoglobin, will admit for  observation and GI evaluation. [CH]    Clinical Course User Index [CH] Aviendha Azbell, Mayer Masker, MD   MDM Rules/Calculators/A&P                          Patient presents with bright red stools and clots.  She is overall nontoxic-appearing and vital signs are reassuring.  She is hemodynamically stable.  No abdominal pain.  I have reviewed prior colonoscopy which showed diffuse diverticulosis.  Given her symptoms and history, suspect diverticular bleed.  Will obtain CBC, BMP, type and screen.  Will monitor closely.  Initially, patient had slowing down of her bleeding without recurrent episodes.  Initial hemoglobin was reassuring although did not likely reflect acute bleed.  Discussed with the patient regarding plan of care.  Plan for repeat hemoglobin at 3 hours to check for stability and monitor for ongoing bleeding.  See clinical course above.  Unfortunately, patient had recurrent heart rate bleeding and downtrending of hemoglobin.  Will admit for observation and GI evaluation.  No indication for transfusion at this time.  Final Clinical Impression(s) / ED Diagnoses Final diagnoses:  Acute GI bleeding    Rx / DC Orders ED Discharge Orders    None       Shon Baton, MD 04/22/20 [phone]

## 2020-04-21 NOTE — ED Notes (Signed)
Pt felt like she had to use the bathroom and had another rectal bleed.

## 2020-04-21 NOTE — ED Triage Notes (Signed)
Pt c/o bright red rectal bleeding x 2 that started this evening. Pt c/o gurgling feel in her stomach and then the rectal bleeding started. Denies use of blood thinners.

## 2020-04-22 ENCOUNTER — Encounter (HOSPITAL_COMMUNITY): Payer: Self-pay | Admitting: Internal Medicine

## 2020-04-22 DIAGNOSIS — E876 Hypokalemia: Secondary | ICD-10-CM | POA: Diagnosis present

## 2020-04-22 DIAGNOSIS — D62 Acute posthemorrhagic anemia: Secondary | ICD-10-CM | POA: Diagnosis present

## 2020-04-22 DIAGNOSIS — K922 Gastrointestinal hemorrhage, unspecified: Secondary | ICD-10-CM | POA: Diagnosis present

## 2020-04-22 DIAGNOSIS — R739 Hyperglycemia, unspecified: Secondary | ICD-10-CM | POA: Diagnosis present

## 2020-04-22 LAB — COMPREHENSIVE METABOLIC PANEL
ALT: 15 U/L (ref 0–44)
AST: 23 U/L (ref 15–41)
Albumin: 3.9 g/dL (ref 3.5–5.0)
Alkaline Phosphatase: 59 U/L (ref 38–126)
Anion gap: 8 (ref 5–15)
BUN: 11 mg/dL (ref 8–23)
CO2: 25 mmol/L (ref 22–32)
Calcium: 9 mg/dL (ref 8.9–10.3)
Chloride: 101 mmol/L (ref 98–111)
Creatinine, Ser: 0.65 mg/dL (ref 0.44–1.00)
GFR, Estimated: >60 mL/min (ref >60)
Glucose, Bld: 126 mg/dL — ABNORMAL HIGH (ref 70–99)
Potassium: 3.3 mmol/L — ABNORMAL LOW (ref 3.5–5.1)
Sodium: 134 mmol/L — ABNORMAL LOW (ref 135–145)
Total Bilirubin: 0.4 mg/dL (ref 0.3–1.2)
Total Protein: 7.5 g/dL (ref 6.5–8.1)

## 2020-04-22 LAB — HEMOGLOBIN
Hemoglobin: 10.2 g/dL — ABNORMAL LOW (ref 12.0–15.0)
Hemoglobin: 10.2 g/dL — ABNORMAL LOW (ref 12.0–15.0)
Hemoglobin: 10 g/dL — ABNORMAL LOW (ref 12.0–15.0)

## 2020-04-22 LAB — CBC WITH DIFFERENTIAL/PLATELET
Abs Immature Granulocytes: 0.02 10*3/uL (ref 0.00–0.07)
Basophils Absolute: 0.1 10*3/uL (ref 0.0–0.1)
Basophils Relative: 1 %
Eosinophils Absolute: 0.1 10*3/uL (ref 0.0–0.5)
Eosinophils Relative: 1 %
HCT: 37.1 % (ref 36.0–46.0)
Hemoglobin: 12.7 g/dL (ref 12.0–15.0)
Immature Granulocytes: 0 %
Lymphocytes Relative: 20 %
Lymphs Abs: 1.5 10*3/uL (ref 0.7–4.0)
MCH: 31.5 pg (ref 26.0–34.0)
MCHC: 34.2 g/dL (ref 30.0–36.0)
MCV: 92.1 fL (ref 80.0–100.0)
Monocytes Absolute: 0.5 10*3/uL (ref 0.1–1.0)
Monocytes Relative: 7 %
Neutro Abs: 5.6 10*3/uL (ref 1.7–7.7)
Neutrophils Relative %: 71 %
Platelets: 350 10*3/uL (ref 150–400)
RBC: 4.03 MIL/uL (ref 3.87–5.11)
RDW: 13.1 % (ref 11.5–15.5)
WBC: 7.8 10*3/uL (ref 4.0–10.5)
nRBC: 0 % (ref 0.0–0.2)

## 2020-04-22 LAB — RESP PANEL BY RT-PCR (FLU A&B, COVID) ARPGX2
Influenza A by PCR: NEGATIVE
Influenza B by PCR: NEGATIVE
SARS Coronavirus 2 by RT PCR: NEGATIVE

## 2020-04-22 LAB — HEMOGLOBIN AND HEMATOCRIT, BLOOD
HCT: 32 % — ABNORMAL LOW (ref 36.0–46.0)
Hemoglobin: 10.6 g/dL — ABNORMAL LOW (ref 12.0–15.0)

## 2020-04-22 LAB — TYPE AND SCREEN
ABO/RH(D): O POS
Antibody Screen: NEGATIVE

## 2020-04-22 LAB — ABO/RH: ABO/RH(D): O POS

## 2020-04-22 LAB — PHOSPHORUS: Phosphorus: 3.5 mg/dL (ref 2.5–4.6)

## 2020-04-22 LAB — MAGNESIUM: Magnesium: 2 mg/dL (ref 1.7–2.4)

## 2020-04-22 MED ORDER — PANTOPRAZOLE SODIUM 40 MG IV SOLR
40.0000 mg | INTRAVENOUS | Status: DC
  Administered 2020-04-22 – 2020-04-24 (×3): 40 mg via INTRAVENOUS
  Filled 2020-04-22 (×3): qty 40

## 2020-04-22 MED ORDER — POTASSIUM CHLORIDE IN NACL 20-0.9 MEQ/L-% IV SOLN
INTRAVENOUS | Status: DC
  Filled 2020-04-22: qty 1000

## 2020-04-22 MED ORDER — POTASSIUM CHLORIDE 10 MEQ/100ML IV SOLN
10.0000 meq | INTRAVENOUS | Status: AC
  Administered 2020-04-22: 04:00:00 10 meq via INTRAVENOUS
  Filled 2020-04-22: qty 100

## 2020-04-22 MED ORDER — ACETAMINOPHEN 650 MG RE SUPP: 650.0000 mg | Freq: Four times a day (QID) | RECTAL | Status: DC | PRN

## 2020-04-22 MED ORDER — ACETAMINOPHEN 325 MG PO TABS: 650.0000 mg | ORAL_TABLET | Freq: Four times a day (QID) | ORAL | Status: DC | PRN

## 2020-04-22 MED ORDER — SODIUM CHLORIDE 0.9 % IV SOLN: INTRAVENOUS | Status: DC

## 2020-04-22 MED ORDER — ONDANSETRON HCL 4 MG/2ML IJ SOLN: 4.0000 mg | Freq: Four times a day (QID) | INTRAMUSCULAR | Status: DC | PRN

## 2020-04-22 MED ORDER — ONDANSETRON HCL 4 MG PO TABS: 4.0000 mg | ORAL_TABLET | Freq: Four times a day (QID) | ORAL | Status: DC | PRN

## 2020-04-22 MED ORDER — PEG 3350-KCL-NA BICARB-NACL 420 G PO SOLR
4000.0000 mL | Freq: Once | ORAL | Status: AC
  Administered 2020-04-22: 17:00:00 4000 mL via ORAL

## 2020-04-22 NOTE — Consult Note (Signed)
Referring Provider: Standley Dakins, MD Primary Care Physician:  Patient, No Pcp Per Primary Gastroenterologist:  Dr. 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  She had an urge to have a bowel movement.  She did not experience abdominal pain.  She noted she was passing liquid stool.  She looked and saw a large amount of bright red blood burgundy blood as well as clots.  She did not experience diaphoresis nausea vomiting or weakness.  She had a second bowel movement after she asked her husband to bring her to emergency room.  She had 3 more bowel movements and emergency room but none today. She may take OTC NSAID for headache or musculoskeletal pain couple of times in a year and no more.  Her bowels been regular.  She has good appetite.  Weight has been stable. She says she is retired but she still goes to [employer] and helps when she is needed. 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 Patient's last colonoscopy was in June 2011 by Dr. Darrick Penna revealing diverticulosis  She is married.  She has son and daughter are both in good health.  Her daughter is at bedside.  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  Now she is only working on as-needed basis she has never smoked cigarettes and she does not drink alcohol.  Past Medical History:  Diagnosis Date  . CIS (carcinoma in situ of cervix)   . Hot flashes 04/26/2013  . Ventral hernia 07/21/2014    Past Surgical History:  Procedure Laterality Date  . ABDOMINAL  HYSTERECTOMY    . bladder tact    . COLON SURGERY      Prior to Admission medications   Medication Sig Start Date End Date Taking? Authorizing Provider  estradiol (CLIMARA - DOSED IN MG/24 HR) 0.1 mg/24hr patch APPLY 1 PATCH TOPICALLY ONCE A WEEK **REMOVE  AND  DISCARD  USED  PATCHES** 11/26/18  Yes Adline Potter, NP  Multiple Vitamin (MULTIVITAMIN) tablet Take 1 tablet by mouth daily.   Yes [provider]  ciprofloxacin (CIPRO) 500 MG tablet Take 1 tablet (500 mg total) by mouth 2 (two) times daily. 11/11/16   Adline Potter, NP  promethazine (PHENERGAN) 25 MG tablet Take 1 tablet (25 mg total) by mouth every 6 (six) hours as needed for nausea or vomiting. 11/11/16   Adline Potter, NP    Current Facility-Administered Medications  Medication Dose Route Frequency Provider Last Rate Last Admin  . 0.9 % NaCl with KCl 20 mEq/ L  infusion   Intravenous Continuous Laural Benes, Clanford L, MD 50 mL/hr at 04/22/20 1329 New Bag at 04/22/20 1329  . acetaminophen (TYLENOL) tablet 650 mg  650 mg Oral Q6H PRN Bobette Mo, MD       Or  . acetaminophen (TYLENOL) suppository 650 mg  650 mg Rectal Q6H PRN Bobette Mo, MD      . ondansetron [facility]) tablet 4 mg  4 mg Oral Q6H PRN Bobette Mo, MD  Or  . ondansetron (ZOFRAN) injection 4 mg  4 mg Intravenous Q6H PRN Bobette Mo, MD      . pantoprazole (PROTONIX) injection 40 mg  40 mg Intravenous Q24H Bobette Mo, MD   40 mg at 04/22/20 6789    Allergies as of 04/21/2020 - Review Complete 04/21/2020  Allergen Reaction Noted  . Shrimp [shellfish allergy] Hives 04/26/2013    Family History  Problem Relation Age of Onset  . Hypertension Mother   . Arthritis Mother   . Dementia Mother   . Early death Father   . Cancer Brother     Social History   Socioeconomic History  . Marital status: Married    Spouse name: Not on file  . Number of children: Not on file  . Years of education: Not  on file  . Highest education level: Not on file  Occupational History  . Not on file  Tobacco Use  . Smoking status: Never Smoker  . Smokeless tobacco: Never Used  Vaping Use  . Vaping Use: Never used  Substance and Sexual Activity  . Alcohol use: No  . Drug use: No  . Sexual activity: Yes    Birth control/protection: Surgical    Comment: hyst  Other Topics Concern  . Not on file  Social History Narrative  . Not on file   Social Determinants of Health   Financial Resource Strain: Not on file  Food Insecurity: Not on file  Transportation Needs: Not on file  Physical Activity: Not on file  Stress: Not on file  Social Connections: Not on file  Intimate Partner Violence: Not on file    Review of Systems: See HPI, otherwise normal ROS  Physical Exam: Temp:  [98 F (36.7 C)-98.5 F (36.9 C)] 98.5 F (36.9 C) (12/25 1436) Pulse Rate:  [70-98] 70 (12/25 1436) Resp:  [16-18] 16 (12/25 1436) BP: (106-153)/(70-100) 117/78 (12/25 1436) SpO2:  [92 %-100 %] 100 % (12/25 1436) Weight:  [84.4 kg] 84.4 kg (12/24 2227) Last BM Date: 04/22/20 (early am)  Patient is alert and in no acute distress. Conjunctiva is pink.  Sclera is nonicteric. Oropharyngeal mucosa is normal. Neck without masses thyromegaly or lymphadenopathy. Cardiac exam with regular rhythm normal S1-S2.  Faint systolic murmur noted at aortic area. Auscultation lungs reveal vesicular breath sounds bilaterally. Abdomen is symmetrical.  She has low midline scar.  Bowel sounds are normal.  On palpation abdomen is soft and nontender with organomegaly or masses. No peripheral edema or clubbing noted.  Lab Results: Recent Labs    04/21/20 2317 04/22/20 0235 04/22/20 0823 04/22/20 1552  WBC 7.8  --   --   --   HGB 12.7 10.6* 10.2* 10.2*  HCT 37.1 32.0*  --   --   PLT 350  --   --   --    BMET Recent Labs    04/21/20 2317  NA 134*  K 3.3*  CL 101  CO2 25  GLUCOSE 126*  BUN 11  CREATININE 0.65  CALCIUM  9.0   LFT Recent Labs    04/21/20 2317  PROT 7.5  ALBUMIN 3.9  AST 23  ALT 15  ALKPHOS 59  BILITOT 0.4    Assessment;  Patient is 64 year old African-American female who presents with large volume painless hematochezia.  She is hemodynamically stable.  Hemoglobin has dropped by about 2 g.  Last colonoscopy was in June 2011 for screening purposes and revealed colonic diverticulosis. We are dealing with diverticular  bleed until proven otherwise.  It appears patient has stopped bleeding.  Would recommend diagnostic colonoscopy.  She is agreeable to proceed with this examination tomorrow in which case she will be prepped this afternoon.  Recommendations;  CBC in a.m. Diagnostic colonoscopy to be performed on 04/23/2020.   LOS: 0 days   Orean Giarratano  04/22/2020, 4:34 PM

## 2020-04-22 NOTE — Plan of Care (Signed)

## 2020-04-22 NOTE — H&P (Signed)
History and Physical    [name] TKZ:[id number] DOB: [birth year] DOA: 04/21/2020  PCP: Patient, No Pcp Per   Patient coming from: Home.   I have personally briefly reviewed patient's old medical records in [facility]  Chief Complaint: Rectal bleeding.  HPI: Alexis Levine is a 64 y.o. female with medical history significant of carcinoma in situ of the cervix, hot flashes, ventral hernia who is coming to the emergency department due to rectal bleeding.  Per patient, she was playing cards shortly after having cheese, grapes and vegetables when she had to get up and go up stairs to the bathroom.  She knows that she had 2 bloody bowel movements at home and after the second one decided to come to the emergency department.  She has had 3 other bloody bowel movements quality in the hospital so far.  She denies abdominal pain, nausea, emesis, constipation or melena.  No dysuria, frequency or hematuria.  No chest pain, dyspnea, palpitations, diaphoresis, PND, orthopnea, pitting edema of the lower extremities, but states that she was mildly lightheaded once when getting up in the emergency department.  She is not currently lightheaded.  She denies any other acute symptoms.  She does not take antiplatelet or anticoagulant medication.  She mentions though that she recently started a supplement about a week ago for hair and skin health, but has not noticed any side effects from it.  ED Course: Initial vital signs were temperature 98.1 F, pulse 98, respiration 18, blood pressure 149/100 mmHg O2 sat 100% on room air.  Labwork: CBC showed a white count of 7.8 with an unremarkable differential, hemoglobin 12.7 g/dL and platelets 573.  3+ hour follow-up hemoglobin was 10.6 g/dL.  CMP showed a sodium 134, potassium 3.3, chloride 101 and CO2 25 mmol/L.  Glucose 126 mg/dL.  Renal and hepatic functions are within normal range.  Normal magnesium and phosphorus levels.  Coronavirus and influenza PCR was  negative.  Review of Systems: As per HPI otherwise all other systems reviewed and are negative.  Past Medical History:  Diagnosis Date  . CIS (carcinoma in situ of cervix)   . Hot flashes 04/26/2013  . Ventral hernia 07/21/2014    Past Surgical History:  Procedure Laterality Date  . ABDOMINAL HYSTERECTOMY    . bladder tact    . COLON SURGERY     Social History  reports that she has never smoked. She has never used smokeless tobacco. She reports that she does not drink alcohol and does not use drugs.  Allergies  Allergen Reactions  . Shrimp [Shellfish Allergy] Hives   Family History  Problem Relation Age of Onset  . Hypertension Mother   . Arthritis Mother   . Dementia Mother   . Early death Father   . Cancer Brother    Prior to Admission medications   Medication Sig Start Date End Date Taking? Authorizing Provider  ciprofloxacin (CIPRO) 500 MG tablet Take 1 tablet (500 mg total) by mouth 2 (two) times daily. 11/11/16   Adline Potter, NP  estradiol (CLIMARA - DOSED IN MG/24 HR) 0.1 mg/24hr patch APPLY 1 PATCH TOPICALLY ONCE A WEEK **REMOVE  AND  DISCARD  USED  PATCHES** 11/26/18   Adline Potter, NP  Multiple Vitamin (MULTIVITAMIN) tablet Take 1 tablet by mouth daily.    [provider]  promethazine (PHENERGAN) 25 MG tablet Take 1 tablet (25 mg total) by mouth every 6 (six) hours as needed for nausea or vomiting. 11/11/16  Adline Potter, NP   Physical Exam: Vitals:   04/22/20 0230 04/22/20 0300 04/22/20 0330 04/22/20 0400  BP: 106/73 110/77 111/74 106/71  Pulse: 72 78 75 78  Resp: 17 16 17 16   Temp:      TempSrc:      SpO2: 100% 100% 100% 96%  Weight:      Height:       Constitutional: NAD, calm, comfortable Eyes: PERRL, lids and conjunctivae normal ENMT: Mucous membranes are moist. Posterior pharynx clear of any exudate or lesions. Neck: normal, supple, no masses, no thyromegaly Respiratory: clear to auscultation bilaterally, no wheezing,  no crackles. Normal respiratory effort. No accessory muscle use.  Cardiovascular: Regular rate and rhythm, no murmurs / rubs / gallops. No extremity edema. 2+ pedal pulses. No carotid bruits.  Abdomen: No distention.  Bowel sounds positive.  Soft, no tenderness, no masses palpated. No hepatosplenomegaly. Bowel sounds positive.  Musculoskeletal: no clubbing / cyanosis. Good ROM, no contractures. Normal muscle tone.  Skin: no rashes, lesions, ulcers on very limited dermatological examination. Neurologic: CN 2-12 grossly intact. Sensation intact, DTR normal. Strength 5/5 in all 4.  Psychiatric: Normal judgment and insight. Alert and oriented x 3. Normal mood.   Labs on Admission: I have personally reviewed following labs and imaging studies  CBC: Recent Labs  Lab 04/21/20 2317 04/22/20 0235  WBC 7.8  --   NEUTROABS 5.6  --   HGB 12.7 10.6*  HCT 37.1 32.0*  MCV 92.1  --   PLT 350  --     Basic Metabolic Panel: Recent Labs  Lab 04/21/20 2317  NA 134*  K 3.3*  CL 101  CO2 25  GLUCOSE 126*  BUN 11  CREATININE 0.65  CALCIUM 9.0  MG 2.0  PHOS 3.5    GFR: Estimated Creatinine Clearance: 77.5 mL/min (by C-G formula based on SCr of 0.65 mg/dL).  Liver Function Tests: Recent Labs  Lab 04/21/20 2317  AST 23  ALT 15  ALKPHOS 59  BILITOT 0.4  PROT 7.5  ALBUMIN 3.9    Urine analysis: No results found for: COLORURINE, APPEARANCEUR, LABSPEC, PHURINE, GLUCOSEU, HGBUR, BILIRUBINUR, KETONESUR, PROTEINUR, UROBILINOGEN, NITRITE, LEUKOCYTESUR  Radiological Exams on Admission: No results found.  EKG: Independently reviewed.  Assessment/Plan Principal Problem:   Acute lower GI bleeding Observation/MedSurg. Keep n.p.o. Continue IV fluids. Pantoprazole 40 mg IVP every 24 hr. Monitor H&H. Transfuse as needed. Consult gastroenterology.  Active Problems:   Acute blood loss anemia As above. Monitor H&H. Transfuse as needed.    Hypokalemia Replacing. Check magnesium  level. Check phosphorus level. Follow-up potassium level.    Hyperglycemia Nonfasting level. Follow glucose level on fasting morning labs.    DVT prophylaxis: SCDs. Code Status:   Full code. Family Communication: Disposition Plan:   Patient is from:  Home.  Anticipated DC to:  Home.  Anticipated DC date:  04/23/2020.  Anticipated DC barriers: Clinical status and GI evaluation.  Consults called:         Routine gastroenterology consult. Admission status:  Observation/MedSurg.  High due to rectal bleeding with a 2.1 g/dL drop in 3 hours with persistent bloody bowel movements.  Severity of Illness:  04/25/2020 MD [facility]  How to contact the [facility] Attending or Consulting provider 7A - 7P or covering provider during after hours 7P -7A, for this patient?   1. Check the care team in [facility] and look for a) attending/consulting [facility] provider listed and b) the [facility] team listed 2. Log  into [email or web address] and use [facility] universal password to access. If you do not have the password, please contact the hospital operator. 3. Locate the [facility] provider you are looking for under [facility] and page to a number that you can be directly reached. 4. If you still have difficulty reaching the provider, please page the [facility] (Director on Call) for the Hospitalists listed on amion for assistance.  04/22/2020, 4:43 AM   This document was prepared using Dragon voice recognition software and may contain some unintended transcription errors.

## 2020-04-22 NOTE — ED Notes (Signed)
Pt ambulated to bathroom without difficulty. Pt still passing a moderate amount of bright red, blood.

## 2020-04-22 NOTE — Progress Notes (Signed)
ASSUMPTION OF CARE NOTE   04/22/2020 12:06 PM  Alexis Levine was seen and examined.  The H&P by the admitting provider, orders, imaging was reviewed.  Please see new orders.  Will continue to follow.  Patient reports no bleeding with the last 3 bowel movements overnight.  Awaiting GI consultation.  Reduce rate of IV fluids  Vitals:   04/22/20 0400 04/22/20 0459  BP: 106/71 113/70  Pulse: 78 79  Resp: 16 18  Temp:  98 F (36.7 C)  SpO2: 96% 100%    Results for orders placed or performed during the hospital encounter of 04/21/20  Resp Panel by RT-PCR (Flu A&B, Covid) Nasopharyngeal Swab   Specimen: Nasopharyngeal Swab; Nasopharyngeal(NP) swabs in vial transport medium  Result Value Ref Range   SARS Coronavirus 2 by RT PCR NEGATIVE NEGATIVE   Influenza A by PCR NEGATIVE NEGATIVE   Influenza B by PCR NEGATIVE NEGATIVE  CBC with Differential  Result Value Ref Range   WBC 7.8 4.0 - 10.5 K/uL   RBC 4.03 3.87 - 5.11 MIL/uL   Hemoglobin 12.7 12.0 - 15.0 g/dL   HCT 52.7 78.2 - 42.3 %   MCV 92.1 80.0 - 100.0 fL   MCH 31.5 26.0 - 34.0 pg   MCHC 34.2 30.0 - 36.0 g/dL   RDW 53.6 14.4 - 31.5 %   Platelets 350 150 - 400 K/uL   nRBC 0.0 0.0 - 0.2 %   Neutrophils Relative % 71 %   Neutro Abs 5.6 1.7 - 7.7 K/uL   Lymphocytes Relative 20 %   Lymphs Abs 1.5 0.7 - 4.0 K/uL   Monocytes Relative 7 %   Monocytes Absolute 0.5 0.1 - 1.0 K/uL   Eosinophils Relative 1 %   Eosinophils Absolute 0.1 0.0 - 0.5 K/uL   Basophils Relative 1 %   Basophils Absolute 0.1 0.0 - 0.1 K/uL   Immature Granulocytes 0 %   Abs Immature Granulocytes 0.02 0.00 - 0.07 K/uL  Comprehensive metabolic panel  Result Value Ref Range   Sodium 134 (L) 135 - 145 mmol/L   Potassium 3.3 (L) 3.5 - 5.1 mmol/L   Chloride 101 98 - 111 mmol/L   CO2 25 22 - 32 mmol/L   Glucose, Bld 126 (H) 70 - 99 mg/dL   BUN 11 8 - 23 mg/dL   Creatinine, Ser 4.00 0.44 - 1.00 mg/dL   Calcium 9.0 8.9 - 86.7 mg/dL   Total Protein 7.5 6.5 - 8.1  g/dL   Albumin 3.9 3.5 - 5.0 g/dL   AST 23 15 - 41 U/L   ALT 15 0 - 44 U/L   Alkaline Phosphatase 59 38 - 126 U/L   Total Bilirubin 0.4 0.3 - 1.2 mg/dL   GFR, Estimated >61 >95 mL/min   Anion gap 8 5 - 15  Hemoglobin and hematocrit, blood  Result Value Ref Range   Hemoglobin 10.6 (L) 12.0 - 15.0 g/dL   HCT 09.3 (L) 26.7 - 12.4 %  Hemoglobin  Result Value Ref Range   Hemoglobin 10.2 (L) 12.0 - 15.0 g/dL  Magnesium  Result Value Ref Range   Magnesium 2.0 1.7 - 2.4 mg/dL  Phosphorus  Result Value Ref Range   Phosphorus 3.5 2.5 - 4.6 mg/dL  Type and screen [facility]  Result Value Ref Range   ABO/RH(D) O POS    Antibody Screen NEG    Sample Expiration      04/25/2020,2359 Performed at [facility], [address], [place],  [place] [address]   ABO/Rh  Result Value Ref Range   ABO/RH(D)      O POS Performed at [facility], [address], [place], [place] [address]      C. Laural Benes, MD [facility]   04/21/2020 10:22 PM How to contact the [facility] Attending or Consulting provider 7A - 7P or covering provider during after hours 7P -7A, for this patient?  1. Check the care team in [facility] and look for a) attending/consulting TRH provider listed and b) the [facility] team listed 2. Log into [email or web address] and use [place] universal password to access. If you do not have the password, please contact the hospital operator. 3. Locate the [facility] provider you are looking for under [facility] and page to a number that you can be directly reached. 4. If you still have difficulty reaching the provider, please page the [facility] (Director on Call) for the Hospitalists listed on amion for assistance.

## 2020-04-22 NOTE — Plan of Care (Signed)

## 2020-04-23 ENCOUNTER — Encounter (HOSPITAL_COMMUNITY)
Admission: EM | Disposition: A | Discharge status: Home / Self Care | Payer: Self-pay | Source: Home / Self Care | Attending: Family Medicine

## 2020-04-23 ENCOUNTER — Encounter (HOSPITAL_COMMUNITY): Payer: Self-pay | Admitting: Internal Medicine

## 2020-04-23 DIAGNOSIS — D62 Acute posthemorrhagic anemia: Secondary | ICD-10-CM | POA: Diagnosis present

## 2020-04-23 DIAGNOSIS — Z8261 Family history of arthritis: Secondary | ICD-10-CM | POA: Diagnosis not present

## 2020-04-23 DIAGNOSIS — Z79890 Hormone replacement therapy (postmenopausal): Secondary | ICD-10-CM | POA: Diagnosis not present

## 2020-04-23 DIAGNOSIS — Z91013 Allergy to seafood: Secondary | ICD-10-CM | POA: Diagnosis not present

## 2020-04-23 DIAGNOSIS — Z86001 Personal history of in-situ neoplasm of cervix uteri: Secondary | ICD-10-CM | POA: Diagnosis not present

## 2020-04-23 DIAGNOSIS — K5731 Diverticulosis of large intestine without perforation or abscess with bleeding: Secondary | ICD-10-CM | POA: Diagnosis present

## 2020-04-23 DIAGNOSIS — Z9049 Acquired absence of other specified parts of digestive tract: Secondary | ICD-10-CM | POA: Diagnosis not present

## 2020-04-23 DIAGNOSIS — E876 Hypokalemia: Secondary | ICD-10-CM | POA: Diagnosis present

## 2020-04-23 DIAGNOSIS — Z809 Family history of malignant neoplasm, unspecified: Secondary | ICD-10-CM | POA: Diagnosis not present

## 2020-04-23 DIAGNOSIS — R739 Hyperglycemia, unspecified: Secondary | ICD-10-CM | POA: Diagnosis present

## 2020-04-23 DIAGNOSIS — K5730 Diverticulosis of large intestine without perforation or abscess without bleeding: Secondary | ICD-10-CM | POA: Diagnosis not present

## 2020-04-23 DIAGNOSIS — Z8249 Family history of ischemic heart disease and other diseases of the circulatory system: Secondary | ICD-10-CM | POA: Diagnosis not present

## 2020-04-23 DIAGNOSIS — Z20822 Contact with and (suspected) exposure to covid-19: Secondary | ICD-10-CM | POA: Diagnosis present

## 2020-04-23 DIAGNOSIS — K625 Hemorrhage of anus and rectum: Secondary | ICD-10-CM | POA: Diagnosis not present

## 2020-04-23 DIAGNOSIS — Z980 Intestinal bypass and anastomosis status: Secondary | ICD-10-CM | POA: Diagnosis not present

## 2020-04-23 DIAGNOSIS — Z79899 Other long term (current) drug therapy: Secondary | ICD-10-CM | POA: Diagnosis not present

## 2020-04-23 DIAGNOSIS — K922 Gastrointestinal hemorrhage, unspecified: Secondary | ICD-10-CM | POA: Diagnosis present

## 2020-04-23 HISTORY — PX: COLONOSCOPY: SHX5424

## 2020-04-23 LAB — CBC
HCT: 28.8 % — ABNORMAL LOW (ref 36.0–46.0)
Hemoglobin: 9.3 g/dL — ABNORMAL LOW (ref 12.0–15.0)
MCH: 30.4 pg (ref 26.0–34.0)
MCHC: 32.3 g/dL (ref 30.0–36.0)
MCV: 94.1 fL (ref 80.0–100.0)
Platelets: 276 10*3/uL (ref 150–400)
RBC: 3.06 MIL/uL — ABNORMAL LOW (ref 3.87–5.11)
RDW: 13 % (ref 11.5–15.5)
WBC: 7.5 10*3/uL (ref 4.0–10.5)
nRBC: 0 % (ref 0.0–0.2)

## 2020-04-23 LAB — COMPREHENSIVE METABOLIC PANEL
ALT: 13 U/L (ref 0–44)
AST: 17 U/L (ref 15–41)
Albumin: 3.1 g/dL — ABNORMAL LOW (ref 3.5–5.0)
Alkaline Phosphatase: 46 U/L (ref 38–126)
Anion gap: 7 (ref 5–15)
BUN: 10 mg/dL (ref 8–23)
CO2: 20 mmol/L — ABNORMAL LOW (ref 22–32)
Calcium: 8 mg/dL — ABNORMAL LOW (ref 8.9–10.3)
Chloride: 109 mmol/L (ref 98–111)
Creatinine, Ser: 0.58 mg/dL (ref 0.44–1.00)
GFR, Estimated: >60 mL/min (ref >60)
Glucose, Bld: 76 mg/dL (ref 70–99)
Potassium: 4.2 mmol/L (ref 3.5–5.1)
Sodium: 136 mmol/L (ref 135–145)
Total Bilirubin: 0.7 mg/dL (ref 0.3–1.2)
Total Protein: 5.8 g/dL — ABNORMAL LOW (ref 6.5–8.1)

## 2020-04-23 LAB — HIV ANTIBODY (ROUTINE TESTING W REFLEX): HIV Screen 4th Generation wRfx: NONREACTIVE

## 2020-04-23 SURGERY — COLONOSCOPY
Anesthesia: Moderate Sedation

## 2020-04-23 MED ORDER — MEPERIDINE HCL 50 MG/ML IJ SOLN
INTRAMUSCULAR | Status: DC | PRN
  Administered 2020-04-23 (×2): 25 mg via INTRAVENOUS

## 2020-04-23 MED ORDER — MEPERIDINE HCL 50 MG/ML IJ SOLN
INTRAMUSCULAR | Status: AC
  Filled 2020-04-23: qty 1

## 2020-04-23 MED ORDER — STERILE WATER FOR IRRIGATION IR SOLN
Status: DC | PRN
  Administered 2020-04-23: 08:00:00 500 mL

## 2020-04-23 MED ORDER — MIDAZOLAM HCL 5 MG/5ML IJ SOLN
INTRAMUSCULAR | Status: DC | PRN
  Administered 2020-04-23: 1 mg via INTRAVENOUS
  Administered 2020-04-23: 2 mg via INTRAVENOUS
  Administered 2020-04-23: 1 mg via INTRAVENOUS
  Administered 2020-04-23: 2 mg via INTRAVENOUS

## 2020-04-23 MED ORDER — MIDAZOLAM HCL 5 MG/5ML IJ SOLN
INTRAMUSCULAR | Status: AC
  Filled 2020-04-23: qty 10

## 2020-04-23 NOTE — Progress Notes (Signed)
I went to see patient upstairs earlier today but she was taking a shower She says she is hungry.  She denies abdominal pain.  She did pass small blood clots when she took her prep.  Last few times and she was tiny clot Patient is agreeable to proceed with diagnostic colonoscopy

## 2020-04-23 NOTE — Progress Notes (Signed)
Brief colonoscopy note.  Normal neoterminal ileum for at least 20 cm. Normal ileocolonic anastomosis located in the region of mid transverse colon. Clots and old blood all the way to transverse colon. Scattered diverticula at transverse, descending and sigmoid colon but none with stigmata of bleed or acute bleeding 1 L of saline used to wash because of free of clots and blood Unremarkable anorectal junction.

## 2020-04-23 NOTE — Progress Notes (Signed)
Hemoglobin is 9.3 g Therefore she must have bled sometime during the night. If there is evidence of recurrent bleed would consider further evaluation.

## 2020-04-23 NOTE — Plan of Care (Signed)

## 2020-04-23 NOTE — Progress Notes (Signed)
PROGRESS NOTE   Alexis Levine  MWN:[id number] DOB: 1956/02/07 DOA: 04/21/2020 PCP: Patient, No Pcp Per   Chief Complaint  Patient presents with  . Rectal Bleeding    Brief Admission History:  64 y.o. female with medical history significant of carcinoma in situ of the cervix, hot flashes, ventral hernia who is coming to the emergency department due to rectal bleeding.  Per patient, she was playing cards shortly after having cheese, grapes and vegetables when she had to get up and go up stairs to the bathroom.  She knows that she had 2 bloody bowel movements at home and after the second one decided to come to the emergency department.  She has had 3 other bloody bowel movements quality in the hospital so far.  She denies abdominal pain, nausea, emesis, constipation or melena.  No dysuria, frequency or hematuria.  No chest pain, dyspnea, palpitations, diaphoresis, PND, orthopnea, pitting edema of the lower extremities, but states that she was mildly lightheaded once when getting up in the emergency department.  She is not currently lightheaded.  She denies any other acute symptoms.  She does not take antiplatelet or anticoagulant medication.   Assessment & Plan:   Principal Problem:   Acute lower GI bleeding Active Problems:   Hypokalemia   Acute blood loss anemia   Hyperglycemia   1. Acute lower GI bleeding - Pt had colonoscopy with GI this morning and findings noted below of a large amount of old blood.  She needs to be monitored longer to be sure she is no longer still bleeding.  Recheck CBC in AM.  2. Hypokalemia - improving continue IV fluid.   3. Acute blood loss anemia - recheck CBC in AM.   4. Generalized weakness - likely multifactorial, continue IV fluid, get PT eval and follow CBC.  Transfuse as needed.   DVT prophylaxis:  SCD Code Status:  Family Communication:  Disposition:   Status is: Observation  The patient will require care spanning > 2 midnights and should be moved to  inpatient because: IV treatments appropriate due to intensity of illness or inability to take PO and Inpatient level of care appropriate due to severity of illness  Dispo: The patient is from: Home              Anticipated d/c is to: Home              Anticipated d/c date is: 1 day              Patient currently is not medically stable to d/c.  Consultants:   GI  Procedures:   Colonoscopy 12/26 Brief colonoscopy note.  Dr. Dorris Carnes. Rehman  Normal neoterminal ileum for at least 20 cm. Normal ileocolonic anastomosis located in the region of mid transverse colon. Clots and old blood all the way to transverse colon. Scattered diverticula at transverse, descending and sigmoid colon but none with stigmata of bleed or acute bleeding 1 L of saline used to wash because of free of clots and blood Unremarkable anorectal junction.         Antimicrobials:  n/a   Subjective: Pt tolerated colon survey well.    Objective: Vitals:   04/23/20 0915 04/23/20 0919 04/23/20 0920 04/23/20 0936  BP:    106/68  Pulse:    84  Resp:    16  Temp:    97.9 F (36.6 C)  TempSrc:    Axillary  SpO2: 100% 100% 100% 100%  Weight:  Height:        Intake/Output Summary (Last 24 hours) at 04/23/2020 1224 Last data filed at 04/22/2020 2225 Gross per 24 hour  Intake 1240 ml  Output --  Net 1240 ml   Filed Weights   04/21/20 2227  Weight: 84.4 kg    Examination:  General exam: Appears calm and comfortable  Respiratory system: Clear to auscultation. Respiratory effort normal. Cardiovascular system: S1 & S2 heard, RRR. No JVD, murmurs, rubs, gallops or clicks. No pedal edema. Gastrointestinal system: Abdomen is nondistended, soft and nontender. No organomegaly or masses felt. Normal bowel sounds heard. Central nervous system: Alert and oriented. No focal neurological deficits. Extremities: Symmetric 5 x 5 power. Skin: No rashes, lesions or ulcers Psychiatry: Judgement and insight appear  normal. Mood & affect appropriate.   Data Reviewed: I have personally reviewed following labs and imaging studies  CBC: Recent Labs  Lab 04/21/20 2317 04/22/20 0235 04/22/20 0823 04/22/20 1552 04/22/20 2234 04/23/20 1023  WBC 7.8  --   --   --   --  7.5  NEUTROABS 5.6  --   --   --   --   --   HGB 12.7 10.6* 10.2* 10.2* 10.0* 9.3*  HCT 37.1 32.0*  --   --   --  28.8*  MCV 92.1  --   --   --   --  94.1  PLT 350  --   --   --   --  276    Basic Metabolic Panel: Recent Labs  Lab 04/21/20 2317 04/23/20 1023  NA 134* 136  K 3.3* 4.2  CL 101 109  CO2 25 20*  GLUCOSE 126* 76  BUN 11 10  CREATININE 0.65 0.58  CALCIUM 9.0 8.0*  MG 2.0  --   PHOS 3.5  --     GFR: Estimated Creatinine Clearance: 77.5 mL/min (by C-G formula based on SCr of 0.58 mg/dL).  Liver Function Tests: Recent Labs  Lab 04/21/20 2317 04/23/20 1023  AST 23 17  ALT 15 13  ALKPHOS 59 46  BILITOT 0.4 0.7  PROT 7.5 5.8*  ALBUMIN 3.9 3.1*    CBG: No results for input(s): GLUCAP in the last 168 hours.  Recent Results (from the past 240 hour(s))  Resp Panel by RT-PCR (Flu A&B, Covid) Nasopharyngeal Swab     Status: None   Collection Time: 04/22/20  3:14 AM   Specimen: Nasopharyngeal Swab; Nasopharyngeal(NP) swabs in vial transport medium  Result Value Ref Range Status   SARS Coronavirus 2 by RT PCR NEGATIVE NEGATIVE Final    Comment: (NOTE) SARS-CoV-2 target nucleic acids are NOT DETECTED.  The SARS-CoV-2 RNA is generally detectable in upper respiratory specimens during the acute phase of infection. The lowest concentration of SARS-CoV-2 viral copies this assay can detect is 138 copies/mL. A negative result does not preclude SARS-Cov-2 infection and should not be used as the sole basis for treatment or other patient management decisions. A negative result may occur with  improper specimen collection/handling, submission of specimen other than nasopharyngeal swab, presence of viral mutation(s)  within the areas targeted by this assay, and inadequate number of viral copies(<138 copies/mL). A negative result must be combined with clinical observations, patient history, and epidemiological information. The expected result is Negative.  Fact Sheet for Patients:  BloggerCourse.com  Fact Sheet for Healthcare Providers:  SeriousBroker.it  This test is no t yet approved or cleared by the [place] FDA and  has been authorized for detection  and/or diagnosis of SARS-CoV-2 by FDA under an Emergency Use Authorization (EUA). This EUA will remain  in effect (meaning this test can be used) for the duration of the COVID-19 declaration under Section 564(b)(1) of the Act, 21 U.S.C.section 360bbb-3(b)(1), unless the authorization is terminated  or revoked sooner.       Influenza A by PCR NEGATIVE NEGATIVE Final   Influenza B by PCR NEGATIVE NEGATIVE Final    Comment: (NOTE) The Xpert Xpress SARS-CoV-2/FLU/RSV plus assay is intended as an aid in the diagnosis of influenza from Nasopharyngeal swab specimens and should not be used as a sole basis for treatment. Nasal washings and aspirates are unacceptable for Xpert Xpress SARS-CoV-2/FLU/RSV testing.  Fact Sheet for Patients: BloggerCourse.com  Fact Sheet for Healthcare Providers: SeriousBroker.it  This test is not yet approved or cleared by the [place] FDA and has been authorized for detection and/or diagnosis of SARS-CoV-2 by FDA under an Emergency Use Authorization (EUA). This EUA will remain in effect (meaning this test can be used) for the duration of the COVID-19 declaration under Section 564(b)(1) of the Act, 21 U.S.C. section 360bbb-3(b)(1), unless the authorization is terminated or revoked.  Performed at [facility], [address] [place], [place] [address]      Radiology Studies: No results found.  Scheduled  Meds: . meperidine      . midazolam      . pantoprazole (PROTONIX) IV  40 mg Intravenous Q24H   Continuous Infusions: . 0.9 % NaCl with KCl 20 mEq / L 50 mL/hr at 04/22/20 1329     LOS: 0 days   Time spent: 36 mins  Chrstopher Malenfant Laural Benes, MD How to contact the [facility] Attending or Consulting provider 7A - 7P or covering provider during after hours 7P -7A, for this patient?  1. Check the care team in [facility] and look for a) attending/consulting TRH provider listed and b) the [facility] team listed 2. Log into [email or web address] and use [place] universal password to access. If you do not have the password, please contact the hospital operator. 3. Locate the [facility] provider you are looking for under [facility] and page to a number that you can be directly reached. 4. If you still have difficulty reaching the provider, please page the [facility] (Director on Call) for the Hospitalists listed on amion for assistance.  04/23/2020, 12:24 PM

## 2020-04-23 NOTE — Op Note (Signed)
[facility] Patient Name: Alexis Levine Procedure Date: 04/23/2020 8:16 AM MRN: [id number] Date of Birth: 10/04/1955 Attending MD: Lionel December , MD CSN: [id number] Age: 64 Admit Type: Outpatient Procedure:                Colonoscopy Indications:              Rectal bleeding Providers:                Lionel December, MD, Nena Polio, RN, Crystal Page Referring MD:             Standley Dakins, MD Medicines:                Meperidine 50 mg IV, Midazolam 6 mg IV Complications:            No immediate complications. Estimated Blood Loss:     Estimated blood loss: none. Procedure:                Pre-Anesthesia Assessment:                           - Prior to the procedure, a History and Physical                            was performed, and patient medications and                            allergies were reviewed. The patient's tolerance of                            previous anesthesia was also reviewed. The risks                            and benefits of the procedure and the sedation                            options and risks were discussed with the patient.                            All questions were answered, and informed consent                            was obtained. Prior Anticoagulants: The patient has                            taken no previous anticoagulant or antiplatelet                            agents. ASA Grade Assessment: II - A patient with                            mild systemic disease. After reviewing the risks                            and benefits, the patient was deemed in  satisfactory condition to undergo the procedure.                           After obtaining informed consent, the colonoscope                            was passed under direct vision. Throughout the                            procedure, the patient's blood pressure, pulse, and                            oxygen saturations were monitored continuously. The                             [id number]-[id number] ([id number] scope was introduced through                            the anus and advanced to the 20 cm into the ileum.                            The colonoscopy was performed without difficulty.                            The patient tolerated the procedure well. The                            quality of the bowel preparation was {skip}                            multiple clots and burgundy blood noted from rectum                            all the way to mid tr. The terminal ileum and the                            rectum were photographed. Scope In: 8:16:00 AM Scope Out: 8:55:06 AM Scope Withdrawal Time: 0 hours 24 minutes 42 seconds  Total Procedure Duration: 0 hours 39 minutes 6 seconds  Findings:      The perianal and digital rectal examinations were normal.      Clotted and burgundy blood was found in the rectum, in the sigmoid       colon, in the descending colon, at the splenic flexure and in the distal       transverse colon. 1 L of normal saline used to wash mucosa of all blood       and clots.      The neo-terminal ileum appeared normal.      There was evidence of a prior end-to-side colo-colonic anastomosis in       the mid transverse colon. This was patent and was characterized by       healthy appearing mucosa. The anastomosis was traversed.      Scattered diverticula were found in the sigmoid colon, descending colon,       splenic flexure and  distal transverse colon.      The retroflexed view of the distal rectum and anal verge was normal and       showed no anal or rectal abnormalities. Impression:               - Blood in the rectum, in the sigmoid colon, in the                            descending colon, at the splenic flexure and in the                            distal transverse colon(clots and burgundy blood).                           - The examined portion of the ileum was normal.                           - Patent end-to-side  colo-colonic anastomosis,                            characterized by healthy appearing mucosa.                           - Diverticulosis in the rectum, in the sigmoid                            colon, in the descending colon, at the splenic                            flexure and in the distal transverse colon.                           - No specimens collected.                           Comment: Endoscopic findings would suggest colonic                            diverticular bleed which has stopped spontaneously.                           Vigorous washing of colonic mucosa undertaken but                            none of the diverticula was bleeding. Moderate Sedation:      Moderate (conscious) sedation was administered by the endoscopy nurse       and supervised by the endoscopist. The following parameters were       monitored: oxygen saturation, heart rate, blood pressure, CO2       capnography and response to care. Total physician intraservice time was       46 minutes. Recommendation:           - Return patient to hospital ward for ongoing care.                           -  Full liquid diet today.                           - Continue present medications.                           - Repeat colonoscopy in 10 years. Procedure Code(s):        --- Professional ---                           [phone], Colonoscopy, flexible; diagnostic, including                            collection of specimen(s) by brushing or washing,                            when performed (separate procedure)                           99153, Moderate sedation; each additional 15                            minutes intraservice time                           99153, Moderate sedation; each additional 15                            minutes intraservice time                           G0500, Moderate sedation services provided by the                            same physician or other qualified health care                             professional performing a gastrointestinal                            endoscopic service that sedation supports,                            requiring the presence of an independent trained                            observer to assist in the monitoring of the                            patient's level of consciousness and physiological                            status; initial 15 minutes of intra-service time;                            patient age 75 years or older (additional time  may                            be reported with 60454, as appropriate) Diagnosis Code(s):        --- Professional ---                           K62.5, Hemorrhage of anus and rectum                           K92.2, Gastrointestinal hemorrhage, unspecified                           Z98.0, Intestinal bypass and anastomosis status                           K57.30, Diverticulosis of large intestine without                            perforation or abscess without bleeding CPT copyright 2019 American [employer] Association. All rights reserved. The codes documented in this report are preliminary and upon coder review may  be revised to meet current compliance requirements. Lionel December, MD Lionel December, MD 04/23/2020 9:18:30 AM This report has been signed electronically. Number of Addenda: 0

## 2020-04-24 DIAGNOSIS — K922 Gastrointestinal hemorrhage, unspecified: Secondary | ICD-10-CM

## 2020-04-24 LAB — CBC
HCT: 25.8 % — ABNORMAL LOW (ref 36.0–46.0)
Hemoglobin: 8.5 g/dL — ABNORMAL LOW (ref 12.0–15.0)
MCH: 30.5 pg (ref 26.0–34.0)
MCHC: 32.9 g/dL (ref 30.0–36.0)
MCV: 92.5 fL (ref 80.0–100.0)
Platelets: 291 10*3/uL (ref 150–400)
RBC: 2.79 MIL/uL — ABNORMAL LOW (ref 3.87–5.11)
RDW: 13 % (ref 11.5–15.5)
WBC: 4.7 10*3/uL (ref 4.0–10.5)
nRBC: 0 % (ref 0.0–0.2)

## 2020-04-24 NOTE — Discharge Instructions (Signed)
Gastrointestinal Bleeding Gastrointestinal (GI) bleeding is bleeding somewhere along the path that food travels through the body (digestive tract). This path is anywhere between the mouth and the opening of the butt (anus). You may have blood in your poop (stool) or have black poop. If you throw up (vomit), there may be blood in it. This condition can be mild, serious, or even life-threatening. If you have a lot of bleeding, you may need to stay in the hospital. What are the causes? This condition may be caused by:  Irritation and swelling of the esophagus (esophagitis). The esophagus is part of the body that moves food from your mouth to your stomach.  Swollen veins in the butt (hemorrhoids).  Areas of painful tearing in the opening of the butt (anal fissures). These are often caused by passing hard poop.  Pouches that form on the colon over time (diverticulosis).  Irritation and swelling (diverticulitis) in areas where pouches have formed on the colon.  Growths (polyps) or cancer. Colon cancer often starts out as growths that are not cancer.  Irritation of the stomach lining (gastritis).  Sores (ulcers) in the stomach. What increases the risk? You are more likely to develop this condition if you:  Have a certain type of infection in your stomach (Helicobacter pylori infection).  Take certain medicines.  Smoke.  Drink alcohol. What are the signs or symptoms? Common symptoms of this condition include:  Throwing up (vomiting) material that has bright red blood in it. It may look like coffee grounds.  Changes in your poop. The poop may: ? Have red blood in it. ? Be black, look like tar, and smell stronger than normal. ? Be red.  Pain or cramping in the belly (abdomen). How is this treated? Treatment for this condition depends on the cause of the bleeding. For example:  Sometimes, the bleeding can be stopped during a procedure that is done to find the problem (endoscopy or  colonoscopy).  Medicines can be used to: ? Help control irritation, swelling, or infection. ? Reduce acid in your stomach.  Certain problems can be treated with: ? Creams. ? Medicines that are put in the butt (suppositories). ? Warm baths.  Surgery is sometimes needed.  If you lose a lot of blood, you may need a blood transfusion. If bleeding is mild, you may be allowed to go home. If there is a lot of bleeding, you will need to stay in the hospital. Follow these instructions at home:   Take over-the-counter and prescription medicines only as told by your doctor.  Eat foods that have a lot of fiber in them. These foods include beans, whole grains, and fresh fruits and vegetables. You can also try eating 1-3 prunes each day.  Drink enough fluid to keep your pee (urine) pale yellow.  Keep all follow-up visits as told by your doctor. This is important. Contact a doctor if:  Your symptoms do not get better. Get help right away if:  Your bleeding does not stop.  You feel dizzy or you pass out (faint).  You feel weak.  You have very bad cramps in your back or belly.  You pass large clumps of blood (clots) in your poop.  Your symptoms are getting worse.  You have chest pain or fast heartbeats. Summary  GI bleeding is bleeding somewhere along the path that food travels through the body (digestive tract).  This bleeding can be caused by many things. Treatment depends on the cause of the bleeding.    Take medicines only as told by your doctor.  Keep all follow-up visits as told by your doctor. This is important. This information is not intended to replace advice given to you by your health care provider. Make sure you discuss any questions you have with your health care provider. Document Revised: 11/26/2017 Document Reviewed: 11/26/2017 Elsevier Patient Education  [place] A soft-food eating plan includes foods that are safe and easy to  chew and swallow. Your health care provider or dietitian can help you find foods and flavors that fit into this plan. Follow this plan until your health care provider or dietitian says it is safe to start eating other foods and food textures. What are tips for following this plan? General guidelines   Take small bites of food, or cut food into pieces about  inch or smaller. Bite-sized pieces of food are easier to chew and swallow.  Eat moist foods. Avoid overly dry foods.  Avoid foods that: ? Are difficult to swallow, such as dry, chunky, crispy, or sticky foods. ? Are difficult to chew, such as hard, tough, or stringy foods. ? Contain nuts, seeds, or fruits.  Follow instructions from your dietitian about the types of liquids that are safe for you to swallow. You may be allowed to have: ? Thick liquids only. This includes only liquids that are thicker than honey. ? Thin and thick liquids. This includes all beverages and foods that become liquid at room temperature.  To make thick liquids: ? Purchase a commercial liquid thickening powder. These are available at grocery stores and pharmacies. ? Mix the thickener into liquids according to instructions on the label. ? Purchase ready-made thickened liquids. ? Thicken soup by pureeing, straining to remove chunks, and adding flour, potato flakes, or corn starch. ? Add commercial thickener to foods that become liquid at room temperature, such as milk shakes, yogurt, ice cream, gelatin, and sherbet.  Ask your health care provider whether you need to take a fiber supplement. Cooking  Cook meats so they stay tender and moist. Use methods like braising, stewing, or baking in liquid.  Cook vegetables and fruit until they are soft enough to be mashed with a fork.  Peel soft, fresh fruits such as peaches, nectarines, and melons.  When making soup, make sure chunks of meat and vegetables are smaller than  inch.  Reheat leftover foods slowly so  that a tough crust does not form. What foods are allowed? The items listed below may not be a complete list. Talk with your dietitian about what dietary choices are best for you. Grains Breads, muffins, pancakes, or waffles moistened with syrup, jelly, or butter. Dry cereals well-moistened with milk. Moist, cooked cereals. Well-cooked pasta and rice. Vegetables All soft-cooked vegetables. Shredded lettuce. Fruits All canned and cooked fruits. Soft, peeled fresh fruits. Strawberries. Dairy Milk. Cream. Yogurt. Cottage cheese. Soft cheese without the rind. Meats and other protein foods Tender, moist ground meat, poultry, or fish. Meat cooked in gravy or sauces. Eggs. Sweets and desserts Ice cream. Milk shakes. Sherbet. Pudding. Fats and oils Butter. Margarine. Olive, canola, sunflower, and grapeseed oil. Smooth salad dressing. Smooth cream cheese. Mayonnaise. Gravy. What foods are not allowed? The items listed bemay not be a complete list. Talk with your dietitian about what dietary choices are best for you. Grains Coarse or dry cereals, such as bran, granola, and shredded wheat. Tough or chewy crusty breads, such as [place] bread or baguettes. Breads with nuts, seeds,  or fruit. Vegetables All raw vegetables. Cooked corn. Cooked vegetables that are tough or stringy. Tough, crisp, fried potatoes and potato skins. Fruits Fresh fruits with skins or seeds, or both, such as apples, pears, and grapes. Stringy, high-pulp fruits, such as papaya, pineapple, coconut, and mango. Fruit leather and all dried fruit. Dairy Yogurt with nuts or coconut. Meats and other protein foods Hard, dry sausages. Dry meat, poultry, or fish. Meats with gristle. Fish with bones. Fried meat or fish. Lunch meat and hotdogs. Nuts and seeds. Chunky peanut butter or other nut butters. Sweets and desserts Cakes or cookies that are very dry or chewy. Desserts with dried fruit, nuts, or coconut. Fried pastries. Very rich  pastries. Fats and oils Cream cheese with fruit or nuts. Salad dressings with seeds or chunks. Summary  A soft-food eating plan includes foods that are safe and easy to swallow. Generally, the foods should be soft enough to be mashed with a fork.  Avoid foods that are dry, hard to chew, crunchy, sticky, stringy, or crispy.  Ask your health care provider whether you need to thicken your liquids and if you need to take a fiber supplement. This information is not intended to replace advice given to you by your health care provider. Make sure you discuss any questions you have with your health care provider. Document Revised: 08/06/2018 Document Reviewed: 06/18/2016 Elsevier Patient Education  2020 [employer]   IMPORTANT INFORMATION: PAY CLOSE ATTENTION   PHYSICIAN DISCHARGE INSTRUCTIONS  Follow with Primary care provider  Patient, No Pcp Per  and other consultants as instructed by your Hospitalist Physician  SEEK MEDICAL CARE OR RETURN TO EMERGENCY ROOM IF SYMPTOMS COME BACK, WORSEN OR NEW PROBLEM DEVELOPS   Please note: You were cared for by a hospitalist during your hospital stay. Every effort will be made to forward records to your primary care provider.  You can request that your primary care provider send for your hospital records if they have not received them.  Once you are discharged, your primary care physician will handle any further medical issues. Please note that NO REFILLS for any discharge medications will be authorized once you are discharged, as it is imperative that you return to your primary care physician (or establish a relationship with a primary care physician if you do not have one) for your post hospital discharge needs so that they can reassess your need for medications and monitor your lab values.  Please get a complete blood count and chemistry panel checked by your Primary MD at your next visit, and again as instructed by your Primary MD.  Get Medicines  reviewed and adjusted: Please take all your medications with you for your next visit with your Primary MD  Laboratory/radiological data: Please request your Primary MD to go over all hospital tests and procedure/radiological results at the follow up, please ask your primary care provider to get all Hospital records sent to his/her office.  In some cases, they will be blood work, cultures and biopsy results pending at the time of your discharge. Please request that your primary care provider follow up on these results.  If you are diabetic, please bring your blood sugar readings with you to your follow up appointment with primary care.    Please call and make your follow up appointments as soon as possible.    Also Note the following: If you experience worsening of your admission symptoms, develop shortness of breath, life threatening emergency, suicidal or homicidal thoughts you must  seek medical attention immediately by calling 911 or calling your MD immediately  if symptoms less severe.  You must read complete instructions/literature along with all the possible adverse reactions/side effects for all the Medicines you take and that have been prescribed to you. Take any new Medicines after you have completely understood and accpet all the possible adverse reactions/side effects.   Do not drive when taking Pain medications or sleeping medications (Benzodiazepines)  Do not take more than prescribed Pain, Sleep and Anxiety Medications. It is not advisable to combine anxiety,sleep and pain medications without talking with your primary care practitioner  Special Instructions: If you have smoked or chewed Tobacco  in the last 2 yrs please stop smoking, stop any regular Alcohol  and or any Recreational drug use.  Wear Seat belts while driving.  Do not drive if taking any narcotic, mind altering or controlled substances or recreational drugs or alcohol.

## 2020-04-24 NOTE — Discharge Summary (Signed)
Physician Discharge Summary  Alexis KelchDebra Reinwald ZOX:[id number] DOB: 03/03/1956 DOA: 04/21/2020  PCP: Patient, No Pcp Per  Admit date: 04/21/2020 Discharge date: 04/24/2020  Admitted From:  Home  Disposition: Home   Recommendations for Outpatient Follow-up:  1. Follow up with PCP in 2 weeks 2. Please obtain CBC in 2 weeks  Discharge Condition: STABLE   CODE STATUS: FULL    Brief Hospitalization Summary: Please see all hospital notes, images, labs for full details of the hospitalization.  ADMISSION HPI: Alexis Levine is a 64 y.o. female with medical history significant of carcinoma in situ of the cervix, hot flashes, ventral hernia who is coming to the emergency department due to rectal bleeding.  Per patient, she was playing cards shortly after having cheese, grapes and vegetables when she had to get up and go up stairs to the bathroom.  She knows that she had 2 bloody bowel movements at home and after the second one decided to come to the emergency department.  She has had 3 other bloody bowel movements quality in the hospital so far.  She denies abdominal pain, nausea, emesis, constipation or melena.  No dysuria, frequency or hematuria.  No chest pain, dyspnea, palpitations, diaphoresis, PND, orthopnea, pitting edema of the lower extremities, but states that she was mildly lightheaded once when getting up in the emergency department.  She is not currently lightheaded.  She denies any other acute symptoms.  She does not take antiplatelet or anticoagulant medication.  She mentions though that she recently started a supplement about a week ago for hair and skin health, but has not noticed any side effects from it.  ED Course: Initial vital signs were temperature 98.1 F, pulse 98, respiration 18, blood pressure 149/100 mmHg O2 sat 100% on room air.  Labwork: CBC showed a white count of 7.8 with an unremarkable differential, hemoglobin 12.7 g/dL and platelets [id number].  3+ hour follow-up hemoglobin was 10.6  g/dL.  CMP showed a sodium 134, potassium 3.3, chloride 101 and CO2 25 mmol/L.  Glucose 126 mg/dL.  Renal and hepatic functions are within normal range.  Normal magnesium and phosphorus levels.  Coronavirus and influenza PCR was negative.    Hospital Course   1. Acute lower GI bleeding - Pt had colonoscopy with GI 12/26 with findings noted below of a large amount of old blood, multiple diverticuli, no active bleeding seen. Old blood washed out with saline solution.   She was monitored overnight to be sure she is no longer having significant bleed.  She was seen by GI this morning and cleared to discharge home.  Recheck CBC in 1-2 weeks.   2. Hypokalemia - repleted.    3. Acute blood loss anemia - stable, recheck hg outpatient.    4. Generalized weakness - improved.   DVT prophylaxis:  SCD Code Status: Full  Family Communication: updated at bedside with plan of care, verbalized understanding Disposition:  Home  Discharge Diagnoses:  Principal Problem:   Acute lower GI bleeding Active Problems:   Hypokalemia   Acute blood loss anemia   Hyperglycemia   Discharge Instructions:  Allergies as of 04/24/2020      Reactions   Shrimp [shellfish Allergy] Hives      Medication List    TAKE these medications   estradiol 0.1 mg/24hr patch Commonly known as: CLIMARA - Dosed in mg/24 hr APPLY 1 PATCH TOPICALLY ONCE A WEEK **REMOVE  AND  DISCARD  USED  PATCHES**   multivitamin tablet Take 1 tablet by  mouth daily.       Follow-up Information    Primary care provider. Schedule an appointment as soon as possible for a visit in 2 week(s).   Why: recheck CBC             Allergies  Allergen Reactions  . Shrimp [Shellfish Allergy] Hives   Allergies as of 04/24/2020      Reactions   Shrimp [shellfish Allergy] Hives      Medication List    TAKE these medications   estradiol 0.1 mg/24hr patch Commonly known as: CLIMARA - Dosed in mg/24 hr APPLY 1 PATCH TOPICALLY ONCE A WEEK  **REMOVE  AND  DISCARD  USED  PATCHES**   multivitamin tablet Take 1 tablet by mouth daily.       Procedures/Studies:  No results found.   Subjective: Pt says she feels well and wants to go home, no further bleeding in stool.   Discharge Exam: Vitals:   04/23/20 2041 04/24/20 0708  BP: 111/67 114/86  Pulse: 91 96  Resp: 18 16  Temp: 98.2 F (36.8 C) 98.2 F (36.8 C)  SpO2: 100% 100%   Vitals:   04/23/20 0920 04/23/20 0936 04/23/20 2041 04/24/20 0708  BP:  106/68 111/67 114/86  Pulse:  84 91 96  Resp:  16 18 16   Temp:  97.9 F (36.6 C) 98.2 F (36.8 C) 98.2 F (36.8 C)  TempSrc:  Axillary Oral   SpO2: 100% 100% 100% 100%  Weight:      Height:       General: Pt is alert, awake, not in acute distress Cardiovascular: RRR, S1/S2 +, no rubs, no gallops Respiratory: CTA bilaterally, no wheezing, no rhonchi Abdominal: Soft, NT, ND, bowel sounds + Extremities: no edema, no cyanosis   The results of significant diagnostics from this hospitalization (including imaging, microbiology, ancillary and laboratory) are listed below for reference.     Microbiology: Recent Results (from the past 240 hour(s))  Resp Panel by RT-PCR (Flu A&B, Covid) Nasopharyngeal Swab     Status: None   Collection Time: 04/22/20  3:14 AM   Specimen: Nasopharyngeal Swab; Nasopharyngeal(NP) swabs in vial transport medium  Result Value Ref Range Status   SARS Coronavirus 2 by RT PCR NEGATIVE NEGATIVE Final    Comment: (NOTE) SARS-CoV-2 target nucleic acids are NOT DETECTED.  The SARS-CoV-2 RNA is generally detectable in upper respiratory specimens during the acute phase of infection. The lowest concentration of SARS-CoV-2 viral copies this assay can detect is 138 copies/mL. A negative result does not preclude SARS-Cov-2 infection and should not be used as the sole basis for treatment or other patient management decisions. A negative result may occur with  improper specimen collection/handling,  submission of specimen other than nasopharyngeal swab, presence of viral mutation(s) within the areas targeted by this assay, and inadequate number of viral copies(<138 copies/mL). A negative result must be combined with clinical observations, patient history, and epidemiological information. The expected result is Negative.  Fact Sheet for Patients:  04/24/20  Fact Sheet for Healthcare Providers:  BloggerCourse.com  This test is no t yet approved or cleared by the [place] FDA and  has been authorized for detection and/or diagnosis of SARS-CoV-2 by FDA under an Emergency Use Authorization (EUA). This EUA will remain  in effect (meaning this test can be used) for the duration of the COVID-19 declaration under Section 564(b)(1) of the Act, 21 U.S.C.section 360bbb-3(b)(1), unless the authorization is terminated  or revoked sooner.  Influenza A by PCR NEGATIVE NEGATIVE Final   Influenza B by PCR NEGATIVE NEGATIVE Final    Comment: (NOTE) The Xpert Xpress SARS-CoV-2/FLU/RSV plus assay is intended as an aid in the diagnosis of influenza from Nasopharyngeal swab specimens and should not be used as a sole basis for treatment. Nasal washings and aspirates are unacceptable for Xpert Xpress SARS-CoV-2/FLU/RSV testing.  Fact Sheet for Patients: BloggerCourse.com  Fact Sheet for Healthcare Providers: SeriousBroker.it  This test is not yet approved or cleared by the [place] FDA and has been authorized for detection and/or diagnosis of SARS-CoV-2 by FDA under an Emergency Use Authorization (EUA). This EUA will remain in effect (meaning this test can be used) for the duration of the COVID-19 declaration under Section 564(b)(1) of the Act, 21 U.S.C. section 360bbb-3(b)(1), unless the authorization is terminated or revoked.  Performed at [facility], [address], [place], [place] [address]      Labs: BNP (last 3 results) No results for input(s): BNP in the last 8760 hours. Basic Metabolic Panel: Recent Labs  Lab 04/21/20 2317 04/23/20 1023  NA 134* 136  K 3.3* 4.2  CL 101 109  CO2 25 20*  GLUCOSE 126* 76  BUN 11 10  CREATININE 0.65 0.58  CALCIUM 9.0 8.0*  MG 2.0  --   PHOS 3.5  --    Liver Function Tests: Recent Labs  Lab 04/21/20 2317 04/23/20 1023  AST 23 17  ALT 15 13  ALKPHOS 59 46  BILITOT 0.4 0.7  PROT 7.5 5.8*  ALBUMIN 3.9 3.1*   No results for input(s): LIPASE, AMYLASE in the last 168 hours. No results for input(s): AMMONIA in the last 168 hours. CBC: Recent Labs  Lab 04/21/20 2317 04/22/20 0235 04/22/20 0823 04/22/20 1552 04/22/20 2234 04/23/20 1023 04/24/20 0556  WBC 7.8  --   --   --   --  7.5 4.7  NEUTROABS 5.6  --   --   --   --   --   --   HGB 12.7 10.6* 10.2* 10.2* 10.0* 9.3* 8.5*  HCT 37.1 32.0*  --   --   --  28.8* 25.8*  MCV 92.1  --   --   --   --  94.1 92.5  PLT 350  --   --   --   --  276 291   Cardiac Enzymes: No results for input(s): CKTOTAL, CKMB, CKMBINDEX, TROPONINI in the last 168 hours. BNP: Invalid input(s): POCBNP CBG: No results for input(s): GLUCAP in the last 168 hours. D-Dimer No results for input(s): DDIMER in the last 72 hours. Hgb A1c No results for input(s): HGBA1C in the last 72 hours. Lipid Profile No results for input(s): CHOL, HDL, LDLCALC, TRIG, CHOLHDL, LDLDIRECT in the last 72 hours. Thyroid function studies No results for input(s): TSH, T4TOTAL, T3FREE, THYROIDAB in the last 72 hours.  Invalid input(s): FREET3 Anemia work up No results for input(s): VITAMINB12, FOLATE, FERRITIN, TIBC, IRON, RETICCTPCT in the last 72 hours. Urinalysis No results found for: COLORURINE, APPEARANCEUR, LABSPEC, PHURINE, GLUCOSEU, HGBUR, BILIRUBINUR, KETONESUR, PROTEINUR, UROBILINOGEN, NITRITE, LEUKOCYTESUR Sepsis Labs Invalid input(s): PROCALCITONIN,  WBC,   LACTICIDVEN Microbiology Recent Results (from the past 240 hour(s))  Resp Panel by RT-PCR (Flu A&B, Covid) Nasopharyngeal Swab     Status: None   Collection Time: 04/22/20  3:14 AM   Specimen: Nasopharyngeal Swab; Nasopharyngeal(NP) swabs in vial transport medium  Result Value Ref Range Status   SARS Coronavirus 2 by RT  PCR NEGATIVE NEGATIVE Final    Comment: (NOTE) SARS-CoV-2 target nucleic acids are NOT DETECTED.  The SARS-CoV-2 RNA is generally detectable in upper respiratory specimens during the acute phase of infection. The lowest concentration of SARS-CoV-2 viral copies this assay can detect is 138 copies/mL. A negative result does not preclude SARS-Cov-2 infection and should not be used as the sole basis for treatment or other patient management decisions. A negative result may occur with  improper specimen collection/handling, submission of specimen other than nasopharyngeal swab, presence of viral mutation(s) within the areas targeted by this assay, and inadequate number of viral copies(<138 copies/mL). A negative result must be combined with clinical observations, patient history, and epidemiological information. The expected result is Negative.  Fact Sheet for Patients:  BloggerCourse.com  Fact Sheet for Healthcare Providers:  [employer]iousBroker.it  This test is no t yet approved or cleared by the [place] FDA and  has been authorized for detection and/or diagnosis of SARS-CoV-2 by FDA under an Emergency Use Authorization (EUA). This EUA will remain  in effect (meaning this test can be used) for the duration of the COVID-19 declaration under Section 564(b)(1) of the Act, 21 U.S.C.section 360bbb-3(b)(1), unless the authorization is terminated  or revoked sooner.       Influenza A by PCR NEGATIVE NEGATIVE Final   Influenza B by PCR NEGATIVE NEGATIVE Final    Comment: (NOTE) The Xpert Xpress SARS-CoV-2/FLU/RSV plus  assay is intended as an aid in the diagnosis of influenza from Nasopharyngeal swab specimens and should not be used as a sole basis for treatment. Nasal washings and aspirates are unacceptable for Xpert Xpress SARS-CoV-2/FLU/RSV testing.  Fact Sheet for Patients: BloggerCourse.com  Fact Sheet for Healthcare Providers: SeriousBroker.it  This test is not yet approved or cleared by the [place] FDA and has been authorized for detection and/or diagnosis of SARS-CoV-2 by FDA under an Emergency Use Authorization (EUA). This EUA will remain in effect (meaning this test can be used) for the duration of the COVID-19 declaration under Section 564(b)(1) of the Act, 21 U.S.C. section 360bbb-3(b)(1), unless the authorization is terminated or revoked.  Performed at [facility], [address], [place], [place] [address]    Time coordinating discharge: 34 mins  SIGNED:  Standley Dakins, MD  [facility] 04/24/2020, 1:23 PM How to contact the [facility] Attending or Consulting provider 7A - 7P or covering provider during after hours 7P -7A, for this patient?  1. Check the care team in [facility] and look for a) attending/consulting TR[facility] provider listed and b) the [facility] team listed 2. Log into [email or web address] and use [place]'s universal password to access. If you do not have the password, please contact the hospital operator. 3. Locate the [facility] provider you are looking for under [facility] and page to a number that you can be directly reached. 4. If you still have difficulty reaching the provider, please page the [facility] (Director on Call) for the Hospitalists listed on amion for assistance.

## 2020-04-24 NOTE — Progress Notes (Signed)
Subjective:  Feels good. Wants to go home. 3 BMs since colonoscopy yesterday, no overt GI bleeding. Tolerating diet.   Objective: Vital signs in last 24 hours: Temp:  [98.2 F (36.8 C)] 98.2 F (36.8 C) (12/27 0708) Pulse Rate:  [91-96] 96 (12/27 0708) Resp:  [16-18] 16 (12/27 0708) BP: (111-114)/(67-86) 114/86 (12/27 0708) SpO2:  [100 %] 100 % (12/27 0708) Last BM Date: 04/23/20 General:   Alert,  Well-developed, well-nourished, pleasant and cooperative in NAD Abdomen:  Soft, nontender and nondistended.   Normal bowel sounds, without guarding, and without rebound.   Extremities:  Without clubbing, deformity or edema. Neurologic:  Alert and  oriented x4;  grossly normal neurologically. Psych:  Alert and cooperative. Normal mood and affect.  Intake/Output from previous day: 12/26 0701 - 12/27 0700 In: 240 [P.O.:240] Out: 1 [Urine:1] Intake/Output this shift: Total I/O In: 480 [P.O.:480] Out: 1 [Urine:1]  Lab Results: CBC Recent Labs    04/21/20 2317 04/22/20 0235 04/22/20 0823 04/22/20 2234 04/23/20 1023 04/24/20 0556  WBC 7.8  --   --   --  7.5 4.7  HGB 12.7 10.6*   < > 10.0* 9.3* 8.5*  HCT 37.1 32.0*  --   --  28.8* 25.8*  MCV 92.1  --   --   --  94.1 92.5  PLT 350  --   --   --  276 291   < > = values in this interval not displayed.   BMET Recent Labs    04/21/20 2317 04/23/20 1023  NA 134* 136  K 3.3* 4.2  CL 101 109  CO2 25 20*  GLUCOSE 126* 76  BUN 11 10  CREATININE 0.65 0.58  CALCIUM 9.0 8.0*   LFTs Recent Labs    04/21/20 2317 04/23/20 1023  BILITOT 0.4 0.7  ALKPHOS 59 46  AST 23 17  ALT 15 13  PROT 7.5 5.8*  ALBUMIN 3.9 3.1*   No results for input(s): LIPASE in the last 72 hours. PT/INR No results for input(s): LABPROT, INR in the last 72 hours.    Imaging Studies: No results found.[2 weeks]   Assessment: 64 year old female who presented with large-volume painless hematochezia.  Colonoscopy completed December 26, blood noted in  the rectum, sigmoid colon, descending colon, splenic flexure and distal transverse colon (clots and burgundy stool).  Examination of the ileum was normal.  Patent end-to-end colocolonic anastomosis with healthy mucosa.  Diverticulosis noted from the distal transverse colon distally.  Endoscopic findings suggestive of colonic diverticular bleed which spontaneously stopped.  Hemoglobin normal at 12.7 on December 24.  Down to 9.3 yesterday and 8.5 today. No further overt GI bleeding noted.   Plan: 1. Repeat colonoscopy in 10 years. 2. Consider low fiber diet for couple of days, then start adding fiber to the diet. Continue high fiber diet due to extensive diverticulosis.  3. Repeat CBC in 2 weeks or sooner if recurrent bleeding. She states she can do this through her work. She will let us know if anemia does not resolve in the next two months.   Leanna Battles. [facility] [phone] 12/27/20211:15 PM       LOS: 1 day

## 2020-04-24 NOTE — Progress Notes (Signed)
IV removed, discharge paper work went over, Pt expressed understanding, pt and belongings walked to the main entrance

## 2020-04-26 ENCOUNTER — Other Ambulatory Visit: Payer: Self-pay

## 2020-04-26 ENCOUNTER — Encounter (HOSPITAL_COMMUNITY): Payer: Self-pay | Admitting: Internal Medicine

## 2020-04-26 DIAGNOSIS — K625 Hemorrhage of anus and rectum: Secondary | ICD-10-CM | POA: Insufficient documentation

## 2020-04-26 NOTE — ED Triage Notes (Signed)
Pt c/o continued rectal bleeding. Pt was admitted and discharged recently for the same.

## 2020-04-27 ENCOUNTER — Emergency Department (HOSPITAL_COMMUNITY)
Admission: EM | Admit: 2020-04-27 | Discharge: 2020-04-27 | Disposition: A | Discharge status: Home / Self Care | Payer: Commercial Managed Care - PPO | Attending: Emergency Medicine | Admitting: Emergency Medicine

## 2020-04-27 DIAGNOSIS — K625 Hemorrhage of anus and rectum: Secondary | ICD-10-CM

## 2020-04-27 LAB — CBC WITH DIFFERENTIAL/PLATELET
Abs Immature Granulocytes: 0.01 10*3/uL (ref 0.00–0.07)
Basophils Absolute: 0 10*3/uL (ref 0.0–0.1)
Basophils Relative: 1 %
Eosinophils Absolute: 0 10*3/uL (ref 0.0–0.5)
Eosinophils Relative: 1 %
HCT: 27.8 % — ABNORMAL LOW (ref 36.0–46.0)
Hemoglobin: 9.1 g/dL — ABNORMAL LOW (ref 12.0–15.0)
Immature Granulocytes: 0 %
Lymphocytes Relative: 17 %
Lymphs Abs: 0.9 10*3/uL (ref 0.7–4.0)
MCH: 30.2 pg (ref 26.0–34.0)
MCHC: 32.7 g/dL (ref 30.0–36.0)
MCV: 92.4 fL (ref 80.0–100.0)
Monocytes Absolute: 0.3 10*3/uL (ref 0.1–1.0)
Monocytes Relative: 6 %
Neutro Abs: 4.1 10*3/uL (ref 1.7–7.7)
Neutrophils Relative %: 75 %
Platelets: 379 10*3/uL (ref 150–400)
RBC: 3.01 MIL/uL — ABNORMAL LOW (ref 3.87–5.11)
RDW: 13.2 % (ref 11.5–15.5)
WBC: 5.4 10*3/uL (ref 4.0–10.5)
nRBC: 0 % (ref 0.0–0.2)

## 2020-04-27 LAB — TYPE AND SCREEN
ABO/RH(D): O POS
Antibody Screen: NEGATIVE

## 2020-04-27 LAB — CBC
HCT: 26.2 % — ABNORMAL LOW (ref 36.0–46.0)
Hemoglobin: 8.8 g/dL — ABNORMAL LOW (ref 12.0–15.0)
MCH: 30.9 pg (ref 26.0–34.0)
MCHC: 33.6 g/dL (ref 30.0–36.0)
MCV: 91.9 fL (ref 80.0–100.0)
Platelets: 359 10*3/uL (ref 150–400)
RBC: 2.85 MIL/uL — ABNORMAL LOW (ref 3.87–5.11)
RDW: 13.2 % (ref 11.5–15.5)
WBC: 5.1 10*3/uL (ref 4.0–10.5)
nRBC: 0 % (ref 0.0–0.2)

## 2020-04-27 LAB — BASIC METABOLIC PANEL
Anion gap: 8 (ref 5–15)
BUN: 12 mg/dL (ref 8–23)
CO2: 27 mmol/L (ref 22–32)
Calcium: 9.2 mg/dL (ref 8.9–10.3)
Chloride: 101 mmol/L (ref 98–111)
Creatinine, Ser: 0.58 mg/dL (ref 0.44–1.00)
GFR, Estimated: >60 mL/min (ref >60)
Glucose, Bld: 123 mg/dL — ABNORMAL HIGH (ref 70–99)
Potassium: 3.6 mmol/L (ref 3.5–5.1)
Sodium: 136 mmol/L (ref 135–145)

## 2020-04-27 LAB — POC OCCULT BLOOD, ED: Fecal Occult Bld: POSITIVE — AB

## 2020-04-27 NOTE — ED Provider Notes (Signed)
[facility] EMERGENCY DEPARTMENT Provider Note   CSN: [id number] Arrival date & time: 04/26/20  2339     History Chief Complaint  Patient presents with  . Rectal Bleeding    Alexis Levine is a 64 y.o. female.  The history is provided by the patient.  Rectal Bleeding Quality:  Bright red Amount:  Moderate Timing:  Intermittent Chronicity:  Recurrent Similar prior episodes: yes   Relieved by:  None tried Worsened by:  Nothing Associated symptoms: no abdominal pain, no dizziness, no fever, no hematemesis, no light-headedness and no vomiting   Risk factors: no anticoagulant use   Patient presents for rectal bleeding.  Patient had recent admission for similar episode.  After discharge on December 27, she reports she did have small normal bowel movements.  However tonight she did have a bloody bowel movement.  No vomiting.  No abdominal pain.     Past Medical History:  Diagnosis Date  . CIS (carcinoma in situ of cervix)   . Hot flashes 04/26/2013  . Ventral hernia 07/21/2014    Patient Active Problem List   Diagnosis Date Noted  . Acute lower GI bleeding 04/22/2020  . Hypokalemia 04/22/2020  . Acute blood loss anemia 04/22/2020  . Hyperglycemia 04/22/2020  . Screening for colorectal cancer 11/11/2016  . Ventral hernia 07/21/2014  . Current use of estrogen therapy 05/04/2013  . Hot flashes 04/26/2013    Past Surgical History:  Procedure Laterality Date  . ABDOMINAL HYSTERECTOMY    . bladder tact    . COLON SURGERY    . COLONOSCOPY N/A 04/23/2020   Procedure: COLONOSCOPY;  Surgeon: Malissa Hippo, MD;  Location: [facility] ENDO SUITE;  Service: Endoscopy;  Laterality: N/A;     OB History    Gravida  2   Para  2   Term      Preterm      AB      Living  2     SAB      IAB      Ectopic      Multiple      Live Births  2           Family History  Problem Relation Age of Onset  . Hypertension Mother   . Arthritis Mother   . Dementia Mother   .  Early death Father   . Cancer Brother     Social History   Tobacco Use  . Smoking status: Never Smoker  . Smokeless tobacco: Never Used  Vaping Use  . Vaping Use: Never used  Substance Use Topics  . Alcohol use: No  . Drug use: No    Home Medications Prior to Admission medications   Medication Sig Start Date End Date Taking? Authorizing Provider  estradiol (CLIMARA - DOSED IN MG/24 HR) 0.1 mg/24hr patch APPLY 1 PATCH TOPICALLY ONCE A WEEK **REMOVE  AND  DISCARD  USED  PATCHES** 11/26/18   Adline Potter, NP  Multiple Vitamin (MULTIVITAMIN) tablet Take 1 tablet by mouth daily.    [provider]    Allergies    Shrimp [shellfish allergy]  Review of Systems   Review of Systems  Constitutional: Negative for fever.  Gastrointestinal: Positive for blood in stool and hematochezia. Negative for abdominal pain, hematemesis and vomiting.  Neurological: Negative for dizziness and light-headedness.  All other systems reviewed and are negative.   Physical Exam Updated Vital Signs BP 119/83   Pulse 83   Temp 98.3 F (36.8 C) (  Oral)   Resp 19   Ht 1.638 m (5' 4.5")   Wt 84.4 kg   SpO2 100%   BMI 31.43 kg/m   Physical Exam  CONSTITUTIONAL: Well developed/well nourished HEAD: Normocephalic/atraumatic EYES: EOMI/PERRL, conjunctive a ENMT: Mucous membranes moist NECK: supple no meningeal signs SPINE/BACK:entire spine nontender CV: S1/S2 noted, no murmurs/rubs/gallops noted LUNGS: Lungs are clear to auscultation bilaterally, no apparent distress ABDOMEN: soft, nontender, no rebound or guarding, bowel sounds noted throughout abdomen GU:no cva tenderness Rectal-no melena.  No external hemorrhoids.  Small amount of dark red blood noted.  Nurse present for exam NEURO: Pt is awake/alert/appropriate, moves all extremitiesx4.  No facial droop.   EXTREMITIES: pulses normal/equal, full ROM SKIN: warm, color normal PSYCH: no abnormalities of mood noted, alert and  oriented to situation  ED Results / Procedures / Treatments   Labs (all labs ordered are listed, but only abnormal results are displayed) Labs Reviewed  BASIC METABOLIC PANEL - Abnormal; Notable for the following components:      Result Value   Glucose, Bld 123 (*)    All other components within normal limits  CBC WITH DIFFERENTIAL/PLATELET - Abnormal; Notable for the following components:   RBC 3.01 (*)    Hemoglobin 9.1 (*)    HCT 27.8 (*)    All other components within normal limits  CBC - Abnormal; Notable for the following components:   RBC 2.85 (*)    Hemoglobin 8.8 (*)    HCT 26.2 (*)    All other components within normal limits  POC OCCULT BLOOD, ED - Abnormal; Notable for the following components:   Fecal Occult Bld POSITIVE (*)    All other components within normal limits  TYPE AND SCREEN    EKG None  Radiology No results found.  Procedures Procedures   Medications Ordered in ED Medications - No data to display  ED Course  I have reviewed the triage vital signs and the nursing notes.  Pertinent labs results that were available during my care of the patient were reviewed by me and considered in my medical decision making (see chart for details).    MDM Rules/Calculators/A&P                          4:37 AM Patient with recent admission.  She had a colonoscopy and was found to have old sites of bleeding, likely felt due to diverticulosis.  She had no signs of any active bleed.  At this time her hemoglobin is stable.  She overall feels well and denies any pain.  She did have a small amount of blood noted on rectal exam  Plan is to monitor in the ER and then recheck hemoglobin. Patient is not on anticoagulation 6:33 AM Patient monitored in the ER for several hours.  She has not had any further bleeding. She is ambulatory.  Minimal change in hemoglobin.  Patient would like to be discharged home.  She will closely monitor her symptoms at home.  If she has any  return or worsen bleeding, she will return to the ER for reevaluation admission.  At this time she does not require a blood transfusion.  Final Clinical Impression(s) / ED Diagnoses Final diagnoses:  Rectal bleeding    Rx / DC Orders ED Discharge Orders    None       Zadie Rhine, MD 04/27/20 [phone]

## 2020-05-01 ENCOUNTER — Telehealth: Payer: Self-pay | Admitting: Gastroenterology

## 2020-05-01 NOTE — Telephone Encounter (Signed)
Recent admission for suspected diverticular bleed. 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 If she needs assistance we can order for her.  Please offer her OV here in couple of months (she saw Dr. Darrick Penna remotely) or she can follow up with Dr. [name] who did her consultation in hospital.

## 2020-05-02 NOTE — Telephone Encounter (Signed)
Phoned and spoke with the pt and was advised by her [facility] and will decide from there whether to come her or see Dr. . States she had spoken with Dr. [name] and was making her decision.

## 2020-05-02 NOTE — Telephone Encounter (Signed)
Sounds good. Thanks 

## 2020-05-02 NOTE — Telephone Encounter (Signed)
noted 

## 2020-05-03 ENCOUNTER — Other Ambulatory Visit: Payer: Self-pay

## 2020-05-03 ENCOUNTER — Ambulatory Visit (HOSPITAL_COMMUNITY)
Admission: RE | Admit: 2020-05-03 | Discharge: 2020-05-03 | Disposition: A | Discharge status: Home / Self Care | Payer: Commercial Managed Care - PPO | Source: Ambulatory Visit | Attending: Adult Health | Admitting: Adult Health

## 2020-05-03 DIAGNOSIS — Z1231 Encounter for screening mammogram for malignant neoplasm of breast: Secondary | ICD-10-CM | POA: Insufficient documentation

## 2020-05-18 ENCOUNTER — Telehealth (INDEPENDENT_AMBULATORY_CARE_PROVIDER_SITE_OTHER): Payer: Self-pay | Admitting: Internal Medicine

## 2020-05-18 NOTE — Telephone Encounter (Signed)
I reviewed patient's blood work from 05/12/2020. She was hospitalized with colonic diverticular bleed last month.  Predischarge hemoglobin was 8.8 g 04/27/2020. Hemoglobin 10.6 on 05/12/2020. She is taking Metamucil every day.  She has not had any more episodes of rectal bleeding. She is to have repeat CBC in a month at [facility] department where she is still working part-time

## 2020-05-30 ENCOUNTER — Encounter (INDEPENDENT_AMBULATORY_CARE_PROVIDER_SITE_OTHER): Payer: Self-pay

## 2020-06-15 ENCOUNTER — Encounter (INDEPENDENT_AMBULATORY_CARE_PROVIDER_SITE_OTHER): Payer: Self-pay

## 2020-06-21 ENCOUNTER — Encounter (INDEPENDENT_AMBULATORY_CARE_PROVIDER_SITE_OTHER): Payer: Self-pay | Admitting: Internal Medicine

## 2020-08-22 ENCOUNTER — Ambulatory Visit (INDEPENDENT_AMBULATORY_CARE_PROVIDER_SITE_OTHER): Payer: Commercial Managed Care - PPO | Admitting: Adult Health

## 2020-08-22 ENCOUNTER — Encounter: Payer: Self-pay | Admitting: Adult Health

## 2020-08-22 ENCOUNTER — Other Ambulatory Visit: Payer: Self-pay

## 2020-08-22 VITALS — BP 121/78 | HR 78 | Ht 65.0 in | Wt 176.0 lb

## 2020-08-22 DIAGNOSIS — E041 Nontoxic single thyroid nodule: Secondary | ICD-10-CM

## 2020-08-22 DIAGNOSIS — N3289 Other specified disorders of bladder: Secondary | ICD-10-CM | POA: Diagnosis not present

## 2020-08-22 DIAGNOSIS — Z01419 Encounter for gynecological examination (general) (routine) without abnormal findings: Secondary | ICD-10-CM | POA: Diagnosis not present

## 2020-08-22 DIAGNOSIS — Z9223 Personal history of estrogen therapy: Secondary | ICD-10-CM | POA: Insufficient documentation

## 2020-08-22 DIAGNOSIS — Z90710 Acquired absence of both cervix and uterus: Secondary | ICD-10-CM | POA: Insufficient documentation

## 2020-08-22 LAB — POCT URINALYSIS DIPSTICK
Blood, UA: NEGATIVE
Glucose, UA: NEGATIVE
Leukocytes, UA: NEGATIVE
Nitrite, UA: NEGATIVE

## 2020-08-22 MED ORDER — ESTRADIOL 0.1 MG/24HR TD PTWK: MEDICATED_PATCH | TRANSDERMAL | 4 refills | Status: DC

## 2020-08-22 NOTE — Progress Notes (Signed)
Patient ID: Alexis Levine, female   DOB: [birth year], 65 y.o.   MRN: [id number] History of Present Illness: Alexis Levine is a 65 year old black female, married, sp hysterectomy in for a well woman gyn exam. 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 She had rectal bleeding in December and spent for 4 days in the hospital, and had negative colonoscopy. She is in study at [facility], beat Alzheimer's disease, and is on low fat diet. She has been off estrogen patch for 2 months and having hot flashes, she wants her patches refilled.  She has noticed bladder spasms post voiding.     Current Medications, Allergies, Past Medical History, Past Surgical History, Family History and Social History were reviewed in [name] record.     Review of Systems:  Patient denies any headaches, hearing loss, fatigue, blurred vision, shortness of breath, chest pain, abdominal pain, problems with bowel movements,  or intercourse. No joint pain or mood swings. See HPI for positives.   Physical Exam:BP 121/78 (BP Location: Left Arm, Patient Position: Sitting, Cuff Size: Normal)   Pulse 78   Ht 5\' 5"  (1.651 m)   Wt 176 lb (79.8 kg)   BMI 29.29 kg/m  urine dipstick is negative. General:  Well developed, well nourished, no acute distress Skin:  Warm and dry Neck:  Midline trachea, right thyroid nodule felt, good ROM, no lymphadenopathy,no carotid bruits heard Lungs; Clear to auscultation bilaterally Breast:  No dominant palpable mass, retraction, or nipple discharge Cardiovascular: Regular rate and rhythm Abdomen:  Soft, non tender, no hepatosplenomegaly,has small reducible ventral hernia  Pelvic:  External genitalia is normal in appearance, no lesions.  The vagina is normal in appearance. Urethra has no lesions or masses. The cervix and uterus are absent, has pelvic relaxation.No adnexal masses or tenderness noted.Bladder is non tender, no masses felt. Rectal:  Deferred Extremities/musculoskeletal:  No swelling or varicosities noted, no clubbing or cyanosis Psych:  No mood changes, alert and cooperative,seems happy AA is 0 Fall risk is low PHQ 9 score is 0 GAD 7 score is 0  Upstream - 08/22/20 1143      Pregnancy Intention Screening   Does the patient want to become pregnant in the next year? No    Does the patient's partner want to become pregnant in the next year? No    Would the patient like to discuss contraceptive options today? No      Contraception Wrap Up   Current Method No Method - Other Reason   hyst   End Method No Method - Other Reason   hyst   Contraception Counseling Provided No          Examination chaperoned by 08/24/20 LPN   Impression and Plan; 1. Encounter for well woman exam with routine gynecological exam Physical in 1 year Mammogram yearly Colonoscopy per Dr Marchelle Folks  2. S/P hysterectomy  3. Right thyroid nodule Thyroid [name] scheduled for 5/3 at 1:30 pm at [facility]  4. Personal history of estrogen therapy Will refill patches Meds ordered this encounter  Medications  . estradiol (CLIMARA - DOSED IN MG/24 HR) 0.1 mg/24hr patch    Sig: APPLY 1 PATCH TOPICALLY ONCE A WEEK **REMOVE  AND  DISCARD  USED  PATCHES**    Dispense:  12 patch    Refill:  4    Please consider 90 day supplies to promote better adherence    Order Specific Question:   Supervising Provider  Answer:   EURE, LUTHER H [[id number]]    5. Bladder spasm

## 2020-08-29 ENCOUNTER — Ambulatory Visit (HOSPITAL_COMMUNITY): Payer: Commercial Managed Care - PPO

## 2020-09-05 ENCOUNTER — Other Ambulatory Visit: Payer: Self-pay

## 2020-09-05 ENCOUNTER — Ambulatory Visit (HOSPITAL_COMMUNITY)
Admission: RE | Admit: 2020-09-05 | Discharge: 2020-09-05 | Disposition: A | Discharge status: Home / Self Care | Payer: Commercial Managed Care - PPO | Source: Ambulatory Visit | Attending: Adult Health | Admitting: Adult Health

## 2020-09-05 DIAGNOSIS — E041 Nontoxic single thyroid nodule: Secondary | ICD-10-CM | POA: Insufficient documentation

## 2021-02-27 ENCOUNTER — Telehealth: Payer: Self-pay

## 2021-02-27 NOTE — Telephone Encounter (Signed)
Called pt to inform her that her insurance wants to change her Estradiol patches to tablets. Pt would like to continue patches, but without insurance coverage, they would be $150.66/3 month supply (confirmed by [facility] pharmacy). Pt stated that she would switch to the tablets unless otherwise specified.

## 2021-08-02 ENCOUNTER — Other Ambulatory Visit (HOSPITAL_COMMUNITY): Payer: Self-pay | Admitting: Adult Health

## 2021-08-02 DIAGNOSIS — Z1231 Encounter for screening mammogram for malignant neoplasm of breast: Secondary | ICD-10-CM

## 2021-08-08 ENCOUNTER — Ambulatory Visit (HOSPITAL_COMMUNITY)
Admission: RE | Admit: 2021-08-08 | Discharge: 2021-08-08 | Disposition: A | Discharge status: Home / Self Care | Payer: Commercial Managed Care - PPO | Source: Ambulatory Visit | Attending: Adult Health | Admitting: Adult Health

## 2021-08-08 DIAGNOSIS — Z1231 Encounter for screening mammogram for malignant neoplasm of breast: Secondary | ICD-10-CM | POA: Diagnosis present

## 2021-10-09 ENCOUNTER — Ambulatory Visit (INDEPENDENT_AMBULATORY_CARE_PROVIDER_SITE_OTHER): Payer: Commercial Managed Care - PPO | Admitting: Adult Health

## 2021-10-09 ENCOUNTER — Encounter: Payer: Self-pay | Admitting: Adult Health

## 2021-10-09 VITALS — BP 146/84 | HR 69 | Ht 63.25 in | Wt 176.5 lb

## 2021-10-09 DIAGNOSIS — Z90710 Acquired absence of both cervix and uterus: Secondary | ICD-10-CM

## 2021-10-09 DIAGNOSIS — R3915 Urgency of urination: Secondary | ICD-10-CM

## 2021-10-09 DIAGNOSIS — Z862 Personal history of diseases of the blood and blood-forming organs and certain disorders involving the immune mechanism: Secondary | ICD-10-CM | POA: Diagnosis not present

## 2021-10-09 DIAGNOSIS — Z1211 Encounter for screening for malignant neoplasm of colon: Secondary | ICD-10-CM

## 2021-10-09 DIAGNOSIS — Z01419 Encounter for gynecological examination (general) (routine) without abnormal findings: Secondary | ICD-10-CM | POA: Diagnosis not present

## 2021-10-09 DIAGNOSIS — Z9223 Personal history of estrogen therapy: Secondary | ICD-10-CM

## 2021-10-09 DIAGNOSIS — N816 Rectocele: Secondary | ICD-10-CM

## 2021-10-09 LAB — HEMOCCULT GUIAC POC 1CARD (OFFICE): Fecal Occult Blood, POC: NEGATIVE

## 2021-10-09 LAB — POCT URINALYSIS DIPSTICK
Glucose, UA: NEGATIVE
Ketones, UA: NEGATIVE
Leukocytes, UA: NEGATIVE
Nitrite, UA: NEGATIVE
Protein, UA: NEGATIVE

## 2021-10-09 LAB — POCT HEMOGLOBIN: Hemoglobin: 12.6 g/dL (ref 11–14.6)

## 2021-10-09 MED ORDER — ESTRADIOL 0.1 MG/24HR TD PTWK: MEDICATED_PATCH | TRANSDERMAL | 4 refills | Status: DC

## 2021-10-09 NOTE — Progress Notes (Signed)
Patient ID: Jayden Rudge, female   DOB: 1955-06-24, 66 y.o.   MRN: [id number] History of Present Illness: Saesha is a 66 year old black female,married sp hysterectomy, on ET, in for a well woman gyn exam. She is still happy with her estrogen patches.  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   Current Medications, Allergies, Past Medical History, Past Surgical History, Family History and Social History were reviewed in [name] record.     Review of Systems: Patient denies any headaches, hearing loss, fatigue, blurred vision, shortness of breath, chest pain, abdominal pain, problems with bowel movements,  or intercourse. No joint pain or mood swings.  She has urinary urgency for about a year.    Physical Exam:BP (!) 146/84 (BP Location: Right Arm, Patient Position: Sitting, Cuff Size: Normal)   Pulse 69   Ht 5' 3.25" (1.607 m)   Wt 176 lb 8 oz (80.1 kg)   BMI 31.02 kg/m  urine dipstick trace blood, HGB 12.6. General:  Well developed, well nourished, no acute distress Skin:  Warm and dry Neck:  Midline trachea, normal thyroid, good ROM, no lymphadenopathy,no carotid bruits heard Lungs; Clear to auscultation bilaterally Breast:  No dominant palpable mass, retraction, or nipple discharge Cardiovascular: Regular rate and rhythm Abdomen:  Soft, non tender, no hepatosplenomegaly Pelvic:  External genitalia is normal in appearance, no lesions.  The vagina is normal in appearance. Urethra has no lesions or masses. The cervix and uterus are absent.  No adnexal masses or tenderness noted.Bladder is non tender, no masses felt. Rectal: Good sphincter tone, no polyps, or hemorrhoids felt.  Hemoccult negative.+rectocele Extremities/musculoskeletal:  No swelling or varicosities noted, no clubbing or cyanosis Psych:  No mood changes, alert and cooperative,seems happy AA is 0 Fall risk is low    10/09/2021   10:46 AM 08/22/2020   11:42 AM 11/11/2016   10:28 AM   Depression screen PHQ 2/9  Decreased Interest 0 0 0  Down, Depressed, Hopeless 0 0 0  PHQ - 2 Score 0 0 0  Altered sleeping 1 0   Tired, decreased energy 0 0   Change in appetite 1 0   Feeling bad or failure about yourself  0 0   Trouble concentrating 0 0   Moving slowly or fidgety/restless 0 0   Suicidal thoughts 0 0   PHQ-9 Score 2 0        10/09/2021   10:46 AM 08/22/2020   11:42 AM  GAD 7 : Generalized Anxiety Score  Nervous, Anxious, on Edge 0 0  Control/stop worrying 0 0  Worry too much - different things 0 0  Trouble relaxing 1 0  Restless 0 0  Easily annoyed or irritable 0 0  Afraid - awful might happen 0 0  Total GAD 7 Score 1 0      Upstream - 10/09/21 1054       Pregnancy Intention Screening   Does the patient want to become pregnant in the next year? N/A    Does the patient's partner want to become pregnant in the next year? N/A    Would the patient like to discuss contraceptive options today? N/A      Contraception Wrap Up   Current Method Female Sterilization   hyst   End Method Female Sterilization   hyst   Contraception Counseling Provided No            Examination chaperoned by Malachy Mood LPN   Impression  and Plan: 1. Urinary urgency UA C&S sent - POCT Urinalysis Dipstick - Urine Culture - Urinalysis, Routine w reflex microscopic  2. History of anemia Take MV - POCT hemoglobin  3. Encounter for well woman exam with routine gynecological exam Physical in 1 year Mammogram yearly Labs at work Colonoscopy per GI   4. S/P hysterectomy   5. Personal history of estrogen therapy Will continue Climara Meds ordered this encounter  Medications   estradiol (CLIMARA - DOSED IN MG/24 HR) 0.1 mg/24hr patch    Sig: APPLY 1 PATCH TOPICALLY ONCE A WEEK **REMOVE  AND  DISCARD  USED  PATCHES**    Dispense:  12 patch    Refill:  4    Please consider 90 day supplies to promote better adherence    Order Specific Question:   Supervising Provider     Answer:   Duane Lope H [[id number]]     6. Encounter for screening fecal occult blood testing Hemoccult was negative  - POCT occult blood stool  7. Rectocele Will follow, not a problem for her

## 2021-10-10 LAB — URINALYSIS, ROUTINE W REFLEX MICROSCOPIC
Bilirubin, UA: NEGATIVE
Glucose, UA: NEGATIVE
Ketones, UA: NEGATIVE
Leukocytes,UA: NEGATIVE
Nitrite, UA: NEGATIVE
Protein,UA: NEGATIVE
RBC, UA: NEGATIVE
Specific Gravity, UA: 1.005 (ref 1.005–1.030)
Urobilinogen, Ur: 0.2 mg/dL (ref 0.2–1.0)
pH, UA: 6.5 (ref 5.0–7.5)

## 2021-10-11 LAB — URINE CULTURE

## 2022-10-23 ENCOUNTER — Other Ambulatory Visit: Payer: Self-pay | Admitting: Adult Health

## 2022-11-28 ENCOUNTER — Telehealth: Payer: Self-pay | Admitting: *Deleted

## 2022-11-28 NOTE — Telephone Encounter (Signed)
Called to [facility] [phone], Estradiol 0.1 mg/24 hr patch apply one patch topically once a week; remove and discard used patches #12 4 refills. JSY

## 2022-12-02 ENCOUNTER — Other Ambulatory Visit (HOSPITAL_COMMUNITY): Payer: Self-pay | Admitting: Adult Health

## 2022-12-02 DIAGNOSIS — Z1231 Encounter for screening mammogram for malignant neoplasm of breast: Secondary | ICD-10-CM

## 2022-12-05 ENCOUNTER — Ambulatory Visit (HOSPITAL_COMMUNITY): Admission: RE | Admit: 2022-12-05 | Payer: Self-pay | Source: Ambulatory Visit

## 2022-12-05 ENCOUNTER — Ambulatory Visit (HOSPITAL_COMMUNITY)
Admission: RE | Admit: 2022-12-05 | Discharge: 2022-12-05 | Disposition: A | Discharge status: Home / Self Care | Payer: Medicare HMO | Source: Ambulatory Visit | Attending: Adult Health | Admitting: Adult Health

## 2022-12-05 DIAGNOSIS — Z1231 Encounter for screening mammogram for malignant neoplasm of breast: Secondary | ICD-10-CM | POA: Insufficient documentation

## 2023-04-09 ENCOUNTER — Other Ambulatory Visit: Payer: Medicare HMO | Admitting: Adult Health

## 2023-04-29 ENCOUNTER — Encounter: Payer: Self-pay | Admitting: Adult Health

## 2023-04-29 ENCOUNTER — Ambulatory Visit: Payer: Medicare HMO | Admitting: Adult Health

## 2023-04-29 VITALS — BP 136/96 | HR 79 | Ht 65.0 in | Wt 176.0 lb

## 2023-04-29 DIAGNOSIS — Z01419 Encounter for gynecological examination (general) (routine) without abnormal findings: Secondary | ICD-10-CM | POA: Diagnosis not present

## 2023-04-29 DIAGNOSIS — N8189 Other female genital prolapse: Secondary | ICD-10-CM

## 2023-04-29 DIAGNOSIS — N816 Rectocele: Secondary | ICD-10-CM

## 2023-04-29 DIAGNOSIS — Z780 Asymptomatic menopausal state: Secondary | ICD-10-CM

## 2023-04-29 DIAGNOSIS — Z90710 Acquired absence of both cervix and uterus: Secondary | ICD-10-CM | POA: Diagnosis not present

## 2023-04-29 DIAGNOSIS — E041 Nontoxic single thyroid nodule: Secondary | ICD-10-CM

## 2023-04-29 DIAGNOSIS — K439 Ventral hernia without obstruction or gangrene: Secondary | ICD-10-CM

## 2023-04-29 DIAGNOSIS — Z1211 Encounter for screening for malignant neoplasm of colon: Secondary | ICD-10-CM

## 2023-04-29 DIAGNOSIS — R030 Elevated blood-pressure reading, without diagnosis of hypertension: Secondary | ICD-10-CM

## 2023-04-29 DIAGNOSIS — R3915 Urgency of urination: Secondary | ICD-10-CM | POA: Diagnosis not present

## 2023-04-29 DIAGNOSIS — Z1331 Encounter for screening for depression: Secondary | ICD-10-CM | POA: Diagnosis not present

## 2023-04-29 LAB — POCT URINALYSIS DIPSTICK
Blood, UA: NEGATIVE
Glucose, UA: NEGATIVE
Ketones, UA: NEGATIVE
Leukocytes, UA: NEGATIVE
Nitrite, UA: NEGATIVE
Protein, UA: NEGATIVE

## 2023-04-29 LAB — HEMOCCULT GUIAC POC 1CARD (OFFICE): Fecal Occult Blood, POC: NEGATIVE

## 2023-04-29 NOTE — Progress Notes (Signed)
 Patient ID: Alexis Levine, female   DOB: 1956/03/17, 67 y.o.   MRN: [id number] History of Present Illness: Alexis Levine is a 67 year old black female,married, sp hysterectomy in for well woman gyn exam. She is retired but works some PT.  No PCP   Current Medications, Allergies, Past Medical History, Past Surgical History, Family History and Social History were reviewed in [name] record.     Review of Systems: Patient denies any headaches, hearing loss, fatigue, blurred vision, shortness of breath, chest pain, abdominal pain, problems with bowel movements,  or intercourse. No joint pain or mood swings.  Has urinary urgency at times, very rare UI Has occasional hot flash   Physical Exam:BP (!) 136/96 (BP Location: Right Arm, Patient Position: Sitting, Cuff Size: Normal)   Pulse 79   Ht 5' 5 (1.651 m)   Wt 176 lb (79.8 kg)   BMI 29.29 kg/m  urine dipstick was negative  General:  Well developed, well nourished, no acute distress Skin:  Warm and dry Neck:  Midline trachea, right  thyroid,nodule, good ROM, no lymphadenopathy,no carotid bruits heard Lungs; Clear to auscultation bilaterally Breast:  No dominant palpable mass, retraction, or nipple discharge Cardiovascular: Regular rate and rhythm Abdomen:  Soft, non tender, no hepatosplenomegaly, has low small ventral hernia Pelvic:  External genitalia is normal in appearance, no lesions.  The vagina is pale, has pelvic relaxation. Urethra has no lesions or masses. The cervix and uterus are absent. No adnexal masses or tenderness noted.Bladder is non tender, no masses felt. Rectal: Good sphincter tone, no polyps, or hemorrhoids felt.  Hemoccult negative.+rectocele Extremities/musculoskeletal:  No swelling or varicosities noted, no clubbing or cyanosis Psych:  No mood changes, alert and cooperative,seems happy AA is 0 Fall risk is low    04/29/2023    2:40 PM 10/09/2021   10:46 AM 08/22/2020   11:42 AM  Depression screen  PHQ 2/9  Decreased Interest 0 0 0  Down, Depressed, Hopeless 0 0 0  PHQ - 2 Score 0 0 0  Altered sleeping 1 1 0  Tired, decreased energy 0 0 0  Change in appetite 0 1 0  Feeling bad or failure about yourself  0 0 0  Trouble concentrating 0 0 0  Moving slowly or fidgety/restless 0 0 0  Suicidal thoughts 0 0 0  PHQ-9 Score 1 2 0       04/29/2023    2:40 PM 10/09/2021   10:46 AM 08/22/2020   11:42 AM  GAD 7 : Generalized Anxiety Score  Nervous, Anxious, on Edge 0 0 0  Control/stop worrying 0 0 0  Worry too much - different things 0 0 0  Trouble relaxing 0 1 0  Restless 0 0 0  Easily annoyed or irritable 0 0 0  Afraid - awful might happen 0 0 0  Total GAD 7 Score 0 1 0      Upstream - 04/29/23 1432       Pregnancy Intention Screening   Does the patient want to become pregnant in the next year? N/A    Does the patient's partner want to become pregnant in the next year? N/A    Would the patient like to discuss contraceptive options today? N/A      Contraception Wrap Up   Current Method Female Sterilization   hyst   End Method Female Sterilization   hyst   Contraception Counseling Provided No  Examination chaperoned by Clarita Salt LPN   Impression and Plan: 1. Urinary urgency Urine dipstick was negative  - POCT Urinalysis Dipstick  2. Encounter for well woman exam with routine gynecological exam (Primary) [place] 12[place]05/22 Colonoscopy per GI To call JAYSON Quarry NP for PCP Encouraged to get shringix and pneumonia vaccine   3. Encounter for screening fecal occult blood testing Hemoccult was negative  - POCT occult blood stool  4. S/P hysterectomy  5. Rectocele  6. Ventral hernia without obstruction or gangrene  7. Right thyroid nodule Had US  2022  8. Pelvic relaxation  9. Postmenopause Pt to call to scheduled DEXA - DG Bone Density; Future  10. Elevated BP without diagnosis of  hypertension Keep check on BP at home

## 2023-06-06 ENCOUNTER — Other Ambulatory Visit: Payer: Self-pay | Admitting: Medical Genetics

## 2023-06-06 DIAGNOSIS — Z006 Encounter for examination for normal comparison and control in clinical research program: Secondary | ICD-10-CM

## 2023-06-20 LAB — GENECONNECT MOLECULAR SCREEN: Genetic Analysis Overall Interpretation: NEGATIVE

## 2023-07-10 IMAGING — MG MM DIGITAL SCREENING BILAT W/ TOMO AND CAD
8 series · 8 of 24 positions shown · non-contrast
Comparison: Previous exam(s).

CLINICAL DATA: Screening.

EXAM:
DIGITAL SCREENING BILATERAL MAMMOGRAM WITH TOMOSYNTHESIS AND CAD
TECHNIQUE: Bilateral screening digital craniocaudal and mediolateral oblique
mammograms were obtained. Bilateral screening digital breast
tomosynthesis was performed. The images were evaluated with
computer-aided detection.

[L CC synth-2D]
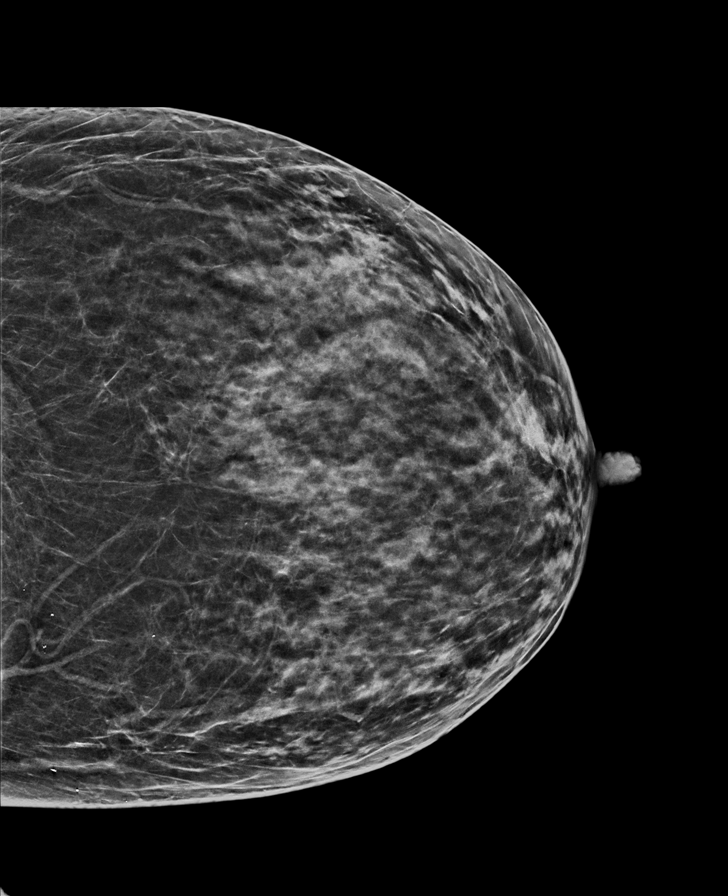

[L MLO synth-2D]
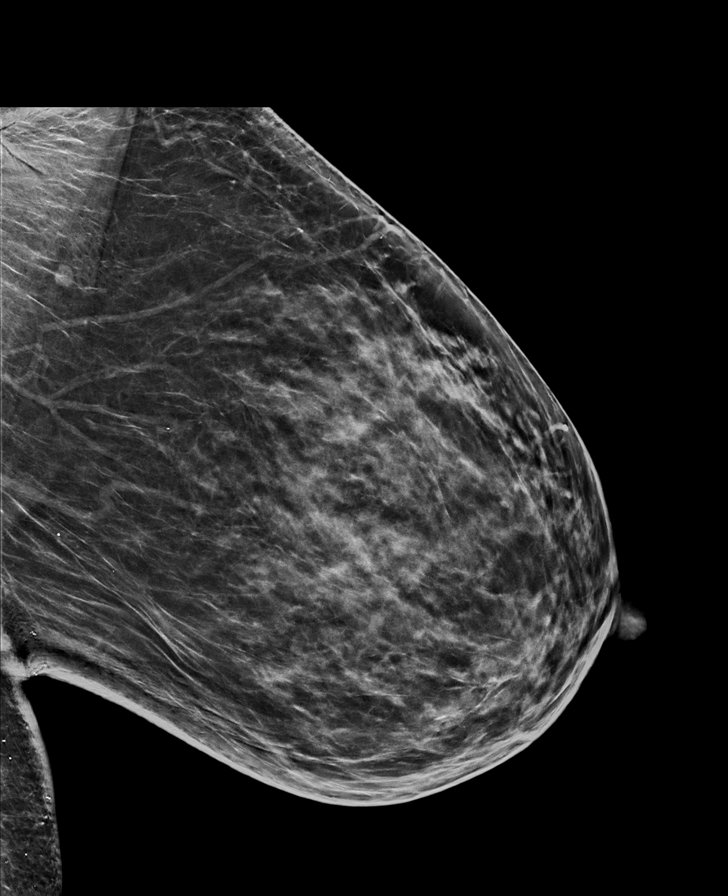

[R MLO synth-2D]
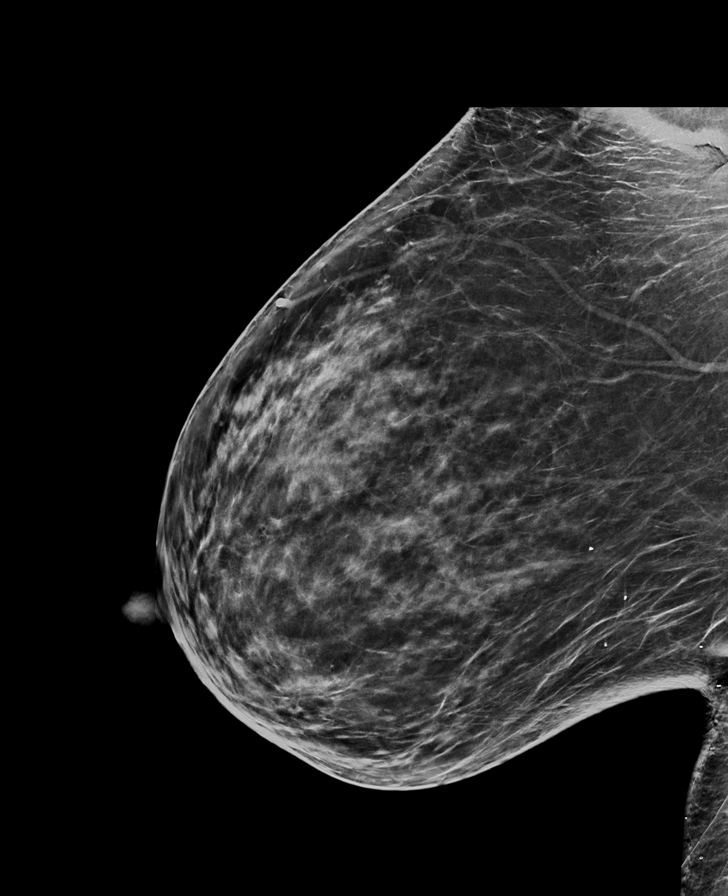

[R CC synth-2D]
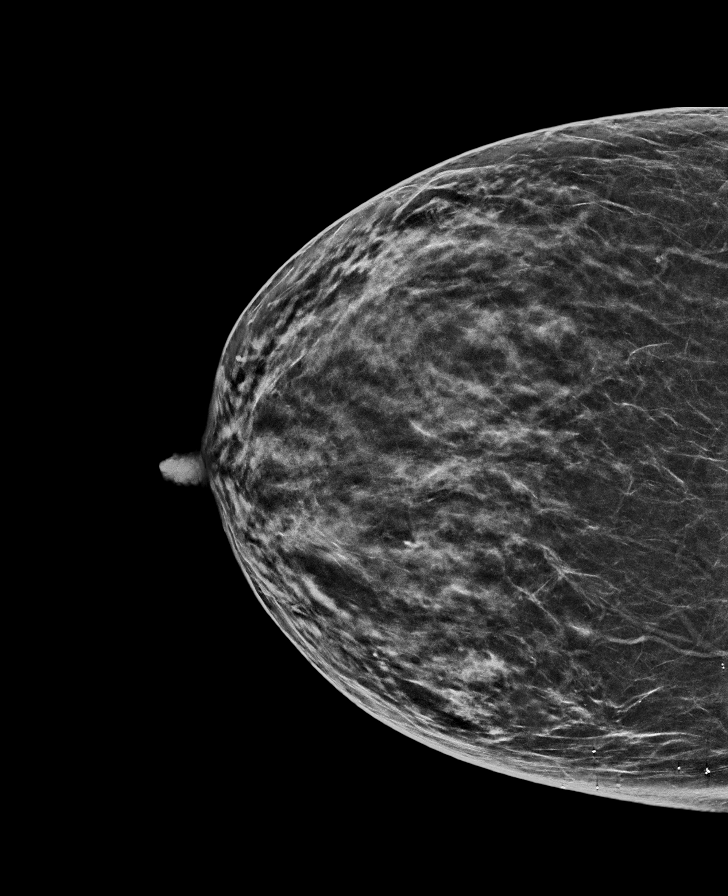

[R MLO tomo · tomo slice 38/75.0]
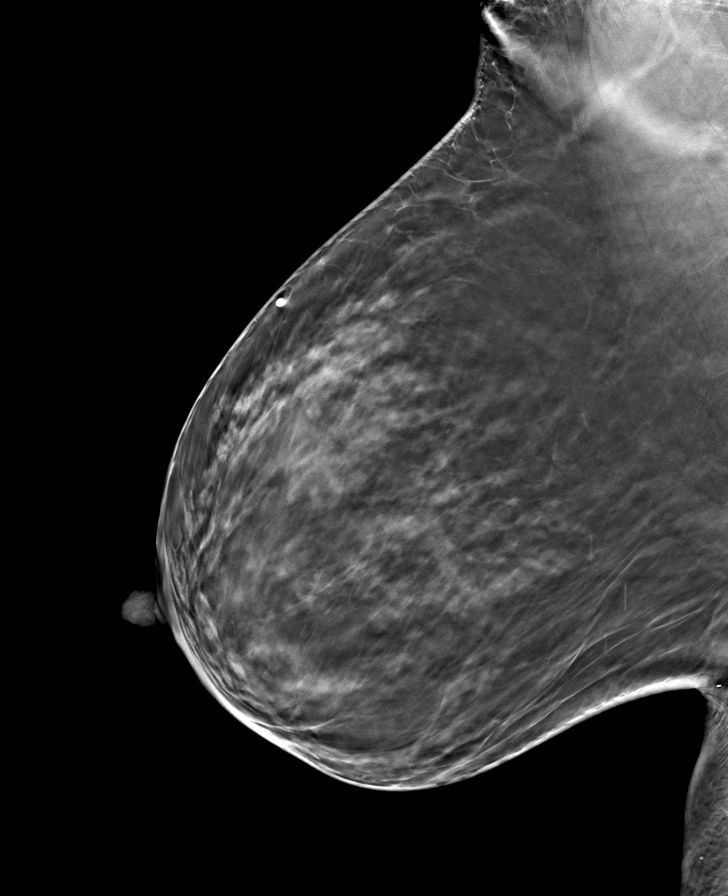

[L CC tomo · tomo slice 33/65.0]
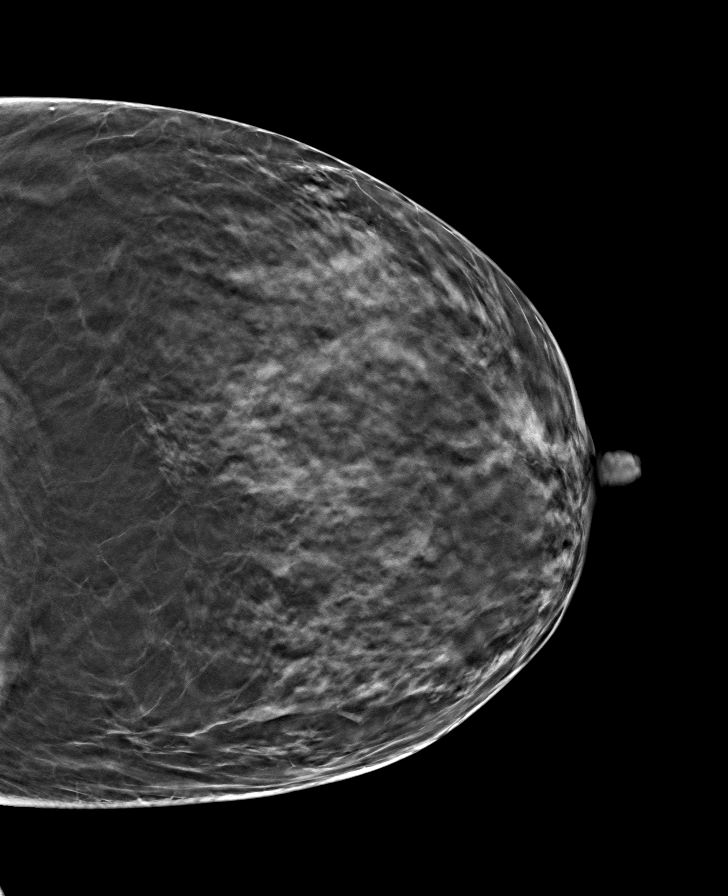

[L MLO tomo · tomo slice 37/74.0]
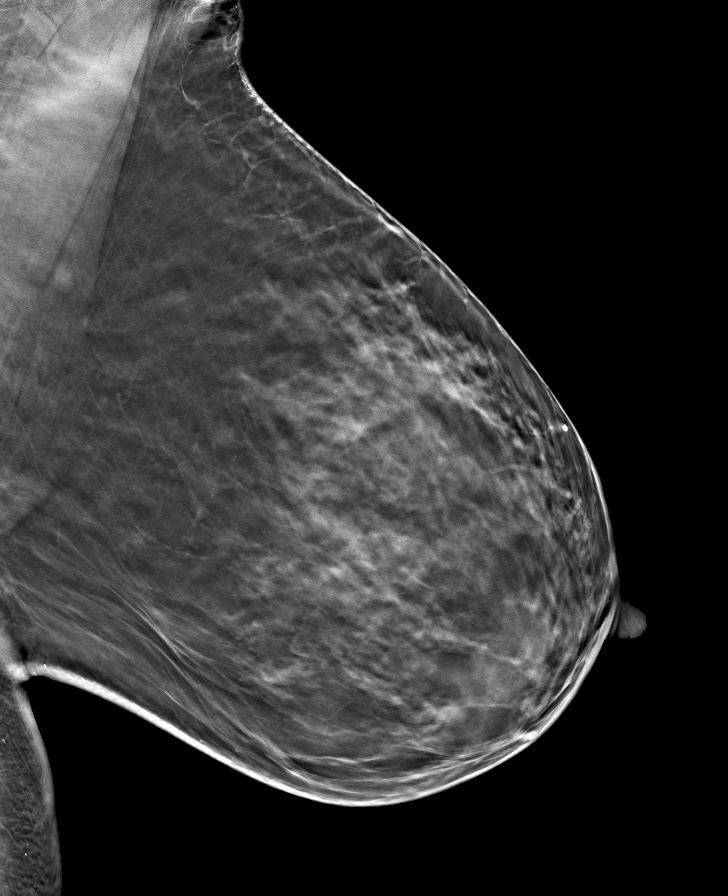

[R CC tomo · tomo slice 33/65.0]
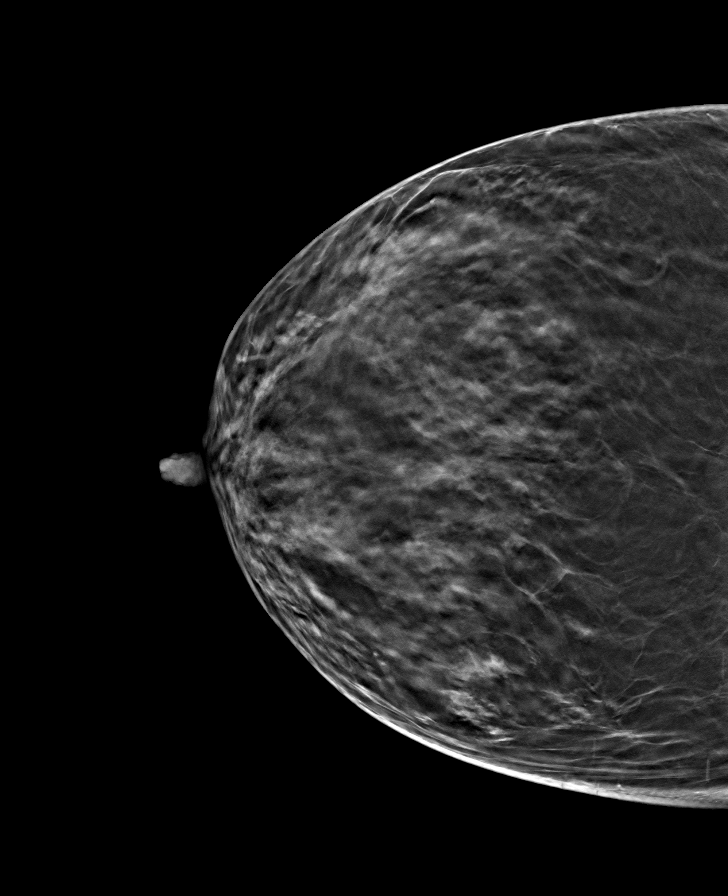

[8 of 24 positions shown; findings below may reference images not displayed]

ACR Breast Density Category c: The breast tissue is heterogeneously
dense, which may obscure small masses.
FINDINGS: There are no findings suspicious for malignancy.
IMPRESSION: No mammographic evidence of malignancy. A result letter of this
screening mammogram will be mailed directly to the patient.

RECOMMENDATION:
Screening mammogram in one year. (Code:Q3-W-BC3)

BI-RADS CATEGORY  1: Negative.

## 2023-07-15 NOTE — Progress Notes (Signed)
 The patient attended a screening event on 06/06/2023 where her screening results were a BP of 170/90 after three checks. At the event the patient noted Dr. Dwana Melena from [place] as her PCP, she has Medicare for insurance, and she is not a smoker. Patient did not indicate any SDOH needs.   Per chart review pt has [employer] and [employer] listed as insurance, Cyril Mourning, NP is her listed PCP where she was last seen by her on 04/29/2023.   CHW called pt to discuss screening results and to confirm PCP, CHW was unable to reach pt, left a vm asking for her to return call. CHW called Dr. Scharlene Gloss office to determine PCP status and if she has any upcoming appts with them. Dr. Scharlene Gloss office confirmed that pt is newly established with them on June 26, 2023, at 10:45 am and she has an upcoming appt on December 24, 2023 at 8:50am with them. Chart has been updated with PCP. CHW called pt again to discuss screening event, pt could not be reached by cell, CHW tried home and left another vm for pt to return call. Called Victorino Dike Griffin's office [facility]) to determine if pt is under their care and if pt does consider her the PCP. CHW was able to confirm provider is on her care team but not her PCP. CHW mail screening results to pt and to share with PCP.   An additional follow up will be done in according to the health equity team's protocol.

## 2023-09-04 ENCOUNTER — Encounter: Payer: Self-pay | Admitting: *Deleted

## 2023-09-04 NOTE — Progress Notes (Signed)
 Pt attended 06/06/2023 screening event where her b/p was 170/90. At the event the pt shared she had established care with Dr. Izetta Marshall and his clinic, did have insurance, did not smoke, and did not identify any SDOH insecurities. 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 Quentin Brunner NP where she had labs and was given Amlodipine 5 mg and now her b/p was running consistently 120/80 or less. Pt stated that HTN "is indeed a silent killer and I would not have known my b/p was up because I had not symptoms." Pt confirmed she remains with Dr. Del Favia and his clinic and NP as her PCP and has appt in August, 2025. No additional health equity team support indicated at this time.

## 2023-12-25 ENCOUNTER — Other Ambulatory Visit (HOSPITAL_COMMUNITY): Payer: Self-pay | Admitting: Adult Health

## 2023-12-25 DIAGNOSIS — Z1231 Encounter for screening mammogram for malignant neoplasm of breast: Secondary | ICD-10-CM

## 2024-01-07 ENCOUNTER — Ambulatory Visit (HOSPITAL_COMMUNITY)
Admission: RE | Admit: 2024-01-07 | Discharge: 2024-01-07 | Disposition: A | Discharge status: Home / Self Care | Source: Ambulatory Visit | Attending: Adult Health | Admitting: Adult Health

## 2024-01-07 ENCOUNTER — Encounter (HOSPITAL_COMMUNITY): Payer: Self-pay

## 2024-01-07 DIAGNOSIS — Z780 Asymptomatic menopausal state: Secondary | ICD-10-CM | POA: Diagnosis present

## 2024-01-07 DIAGNOSIS — Z1231 Encounter for screening mammogram for malignant neoplasm of breast: Secondary | ICD-10-CM | POA: Diagnosis present

## 2024-01-08 ENCOUNTER — Ambulatory Visit: Payer: Self-pay | Admitting: Adult Health

## 2024-01-12 ENCOUNTER — Ambulatory Visit: Payer: Self-pay | Admitting: Adult Health
